# Patient Record
Sex: Male | Born: 1945
Health system: Southern US, Community
[De-identification: ages and names within clinical notes are randomized; demographics above are authoritative.]

## PROBLEM LIST (undated history)

## (undated) DIAGNOSIS — I10 Essential (primary) hypertension: Secondary | ICD-10-CM

## (undated) DIAGNOSIS — J439 Emphysema, unspecified: Secondary | ICD-10-CM

## (undated) DIAGNOSIS — F410 Panic disorder [episodic paroxysmal anxiety] without agoraphobia: Secondary | ICD-10-CM

## (undated) DIAGNOSIS — G4733 Obstructive sleep apnea (adult) (pediatric): Secondary | ICD-10-CM

## (undated) HISTORY — DX: Obstructive sleep apnea (adult) (pediatric): G47.33

## (undated) HISTORY — DX: Panic disorder (episodic paroxysmal anxiety): F41.0

## (undated) HISTORY — DX: Essential (primary) hypertension: I10

## (undated) HISTORY — DX: Emphysema, unspecified: J43.9

---

## 2011-03-21 DIAGNOSIS — J209 Acute bronchitis, unspecified: Secondary | ICD-10-CM | POA: Diagnosis not present

## 2011-03-21 DIAGNOSIS — R0989 Other specified symptoms and signs involving the circulatory and respiratory systems: Secondary | ICD-10-CM | POA: Diagnosis not present

## 2011-03-21 DIAGNOSIS — J449 Chronic obstructive pulmonary disease, unspecified: Secondary | ICD-10-CM | POA: Diagnosis not present

## 2011-04-19 DIAGNOSIS — R635 Abnormal weight gain: Secondary | ICD-10-CM | POA: Diagnosis not present

## 2011-04-19 DIAGNOSIS — R413 Other amnesia: Secondary | ICD-10-CM | POA: Diagnosis not present

## 2011-04-19 DIAGNOSIS — N401 Enlarged prostate with lower urinary tract symptoms: Secondary | ICD-10-CM | POA: Diagnosis not present

## 2011-04-19 DIAGNOSIS — Z125 Encounter for screening for malignant neoplasm of prostate: Secondary | ICD-10-CM | POA: Diagnosis not present

## 2011-04-19 DIAGNOSIS — N138 Other obstructive and reflux uropathy: Secondary | ICD-10-CM | POA: Diagnosis not present

## 2011-04-23 DIAGNOSIS — Z1211 Encounter for screening for malignant neoplasm of colon: Secondary | ICD-10-CM | POA: Diagnosis not present

## 2011-04-30 DIAGNOSIS — I70219 Atherosclerosis of native arteries of extremities with intermittent claudication, unspecified extremity: Secondary | ICD-10-CM | POA: Diagnosis not present

## 2011-04-30 DIAGNOSIS — I2510 Atherosclerotic heart disease of native coronary artery without angina pectoris: Secondary | ICD-10-CM | POA: Diagnosis not present

## 2011-05-23 DIAGNOSIS — Z79899 Other long term (current) drug therapy: Secondary | ICD-10-CM | POA: Diagnosis not present

## 2011-05-23 DIAGNOSIS — Z23 Encounter for immunization: Secondary | ICD-10-CM | POA: Diagnosis not present

## 2011-05-23 DIAGNOSIS — F321 Major depressive disorder, single episode, moderate: Secondary | ICD-10-CM | POA: Diagnosis not present

## 2011-05-29 DIAGNOSIS — R5381 Other malaise: Secondary | ICD-10-CM | POA: Diagnosis not present

## 2011-05-29 DIAGNOSIS — R0789 Other chest pain: Secondary | ICD-10-CM | POA: Diagnosis not present

## 2011-06-05 DIAGNOSIS — F321 Major depressive disorder, single episode, moderate: Secondary | ICD-10-CM | POA: Diagnosis not present

## 2011-06-08 DIAGNOSIS — F321 Major depressive disorder, single episode, moderate: Secondary | ICD-10-CM | POA: Diagnosis not present

## 2011-09-26 DIAGNOSIS — F411 Generalized anxiety disorder: Secondary | ICD-10-CM | POA: Diagnosis not present

## 2011-09-26 DIAGNOSIS — R413 Other amnesia: Secondary | ICD-10-CM | POA: Diagnosis not present

## 2011-10-04 DIAGNOSIS — Z79899 Other long term (current) drug therapy: Secondary | ICD-10-CM | POA: Diagnosis not present

## 2011-10-04 DIAGNOSIS — R413 Other amnesia: Secondary | ICD-10-CM | POA: Diagnosis not present

## 2011-10-17 DIAGNOSIS — Z09 Encounter for follow-up examination after completed treatment for conditions other than malignant neoplasm: Secondary | ICD-10-CM | POA: Diagnosis not present

## 2011-12-17 DIAGNOSIS — Z23 Encounter for immunization: Secondary | ICD-10-CM | POA: Diagnosis not present

## 2012-01-17 DIAGNOSIS — J449 Chronic obstructive pulmonary disease, unspecified: Secondary | ICD-10-CM | POA: Diagnosis not present

## 2012-01-17 DIAGNOSIS — E782 Mixed hyperlipidemia: Secondary | ICD-10-CM | POA: Diagnosis not present

## 2012-01-17 DIAGNOSIS — R413 Other amnesia: Secondary | ICD-10-CM | POA: Diagnosis not present

## 2012-01-17 DIAGNOSIS — Z79899 Other long term (current) drug therapy: Secondary | ICD-10-CM | POA: Diagnosis not present

## 2012-02-13 DIAGNOSIS — J449 Chronic obstructive pulmonary disease, unspecified: Secondary | ICD-10-CM | POA: Diagnosis not present

## 2012-02-13 DIAGNOSIS — R413 Other amnesia: Secondary | ICD-10-CM | POA: Diagnosis not present

## 2012-05-14 DIAGNOSIS — J449 Chronic obstructive pulmonary disease, unspecified: Secondary | ICD-10-CM | POA: Diagnosis not present

## 2012-05-15 ENCOUNTER — Encounter: Payer: Self-pay | Admitting: Internal Medicine

## 2012-05-15 ENCOUNTER — Ambulatory Visit (INDEPENDENT_AMBULATORY_CARE_PROVIDER_SITE_OTHER): Payer: Medicare Other | Admitting: Internal Medicine

## 2012-05-15 ENCOUNTER — Ambulatory Visit (INDEPENDENT_AMBULATORY_CARE_PROVIDER_SITE_OTHER)
Admission: RE | Admit: 2012-05-15 | Discharge: 2012-05-15 | Disposition: A | Discharge status: Home / Self Care | Payer: Medicare Other | Source: Ambulatory Visit | Attending: Internal Medicine | Admitting: Internal Medicine

## 2012-05-15 VITALS — BP 150/78 | HR 70 | Temp 97.2°F | Ht 72.0 in | Wt 270.6 lb

## 2012-05-15 DIAGNOSIS — J449 Chronic obstructive pulmonary disease, unspecified: Secondary | ICD-10-CM

## 2012-05-15 DIAGNOSIS — I10 Essential (primary) hypertension: Secondary | ICD-10-CM | POA: Diagnosis not present

## 2012-05-15 MED ORDER — OLMESARTAN MEDOXOMIL-HCTZ 20-12.5 MG PO TABS: 1.0000 | ORAL_TABLET | Freq: Every day | ORAL | Status: AC

## 2012-05-15 NOTE — Assessment & Plan Note (Signed)
ACE inhibitors are problematic in  pts with airway complaints because  even experienced pulmonologists can't always distinguish ace effects from copd/asthma.  By themselves they don't actually cause a problem, much like oxygen can't by itself start a fire, but they certainly serve as a powerful catalyst or enhancer for any "fire"  or inflammatory process in the upper airway, be it caused by an ET  tube or more commonly reflux (especially in the obese or pts with known GERD or who are on biphoshonates).    In the era of ARB near equivalency until we have a better handle on the reversibility of the airway problem, it just makes sense to avoid ACEI  entirely in the short run and then decide later, having established a level of airway control using a reasonable limited regimen, whether to add back ace but even then being very careful to observe the pt for worsening airway control and number of meds used/ needed to control symptoms.    Will rx with benicar samples then regroup. See instructions for specific recommendations which were reviewed directly with the patient who was given a copy with highlighter outlining the key components.

## 2012-05-15 NOTE — Progress Notes (Signed)
  Subjective:    Patient ID: Richard Baker, male    DOB: Jul 15, 1945, 67 y.o.   MRN: [id number]  HPI  23 yowm quit smoking around 2000 with onset of doe gradually worse since quit referred by Dr Lorin Picket for pulmonary eval of dx of copd made by Dr Lucile Crater.  05/15/2012 1st pulmonary eval /Wert on chronic acei cc doe x decade but p quit smoking able to do walking exercise and lost about 30 lbs around 2004 "felt really good" but since then gradual worse sob and only a little better with symbicort but still needing nebulizer at least twice daily at baseline.  Notes variable hoarseness and sense of throat and cough/chest congestion.  Can walk slow "as far as I want" like at a mall but can't get in a hurry. Some leg swelling but no correlation with sob.  No obvious pattern to daytime variabilty or assoc purulent sputum or cp or chest tightness, subjective wheeze overt sinus or hb symptoms. No unusual exp hx or h/o childhood pna/ asthma or premature birth to his knowledge.   Sleeping ok without nocturnal  or early am exacerbation  of respiratory  c/o's or need for noct saba. Also denies any obvious fluctuation of symptoms with weather or environmental changes or other aggravating or alleviating factors except as outlined above        Review of Systems  Constitutional: Negative for fever, chills, activity change, appetite change and unexpected weight change.  HENT: Positive for congestion. Negative for sore throat, rhinorrhea, sneezing, trouble swallowing, dental problem, voice change and postnasal drip.   Eyes: Negative for visual disturbance.  Respiratory: Positive for cough and shortness of breath. Negative for choking.   Cardiovascular: Positive for leg swelling. Negative for chest pain.  Gastrointestinal: Negative for nausea, vomiting and abdominal pain.  Genitourinary: Negative for difficulty urinating.  Musculoskeletal: Negative for arthralgias.  Skin: Negative for rash.  Psychiatric/Behavioral:  Negative for behavioral problems and confusion.       Objective:   Physical Exam Wt Readings from Last 3 Encounters:  05/15/12 270 lb 9.6 oz (122.743 kg)   amb obese wm gruff voice  HEENT mild turbinate edema.  Oropharynx no thrush or excess pnd or cobblestoning.  No JVD or cervical adenopathy. Mild accessory muscle hypertrophy. Trachea midline, nl thryroid. Chest was hyperinflated by percussion with diminished breath sounds and moderate increased exp time without wheeze. Hoover sign positive at mid inspiration. Regular rate and rhythm without murmur gallop or rub or increase P2 or edema.  Abd: no hsm, nl excursion. Ext warm without cyanosis or clubbing.    CXR  05/15/2012 :  No radiographic evidence of acute cardiopulmonary disease.        Assessment & Plan:

## 2012-05-15 NOTE — Patient Instructions (Addendum)
Stop lisinopril and start benicar 20/12.5 one daily   Continue symbicort Take 2 puffs first thing in am and then another 2 puffs about 12 hours later.     Only use your albuterol as a rescue medication to be used if you can't catch your breath by resting or doing a relaxed purse lip breathing pattern. The less you use it, the better it will work when you need it.   GERD (REFLUX)  is an extremely common cause of respiratory symptoms, many times with no significant heartburn at all.    It can be treated with medication, but also with lifestyle changes including avoidance of late meals, excessive alcohol, smoking cessation, and avoid fatty foods, chocolate, peppermint, colas, red wine, and acidic juices such as orange juice.  NO MINT OR MENTHOL PRODUCTS SO NO COUGH DROPS  USE SUGARLESS CANDY INSTEAD (jolley ranchers or Stover's)  NO OIL BASED VITAMINS - use powdered substitutes.   Please remember to go to the  x-ray department downstairs for your tests - we will call you with the results when they are available.    Please schedule a follow up office visit in 4 weeks, sooner if needed with pfts

## 2012-05-15 NOTE — Assessment & Plan Note (Addendum)
  When respiratory symptoms begin or become refractory well after a patient reports complete smoking cessation,  Especially when this wasn't the case while they were smoking, a red flag is raised based on the work of Dr Primitivo Gauze which states:  if you quit smoking when your best day FEV1 is still well preserved it is highly unlikely you will progress to severe disease.  That is to say, once the smoking stops,  the symptoms should not suddenly erupt or markedly worsen.  If so, the differential diagnosis should include  obesity/deconditioning,  LPR/Reflux/Aspiration syndromes (always a concern in pts with sign wt gain),  occult CHF, or  especially side effect of medications commonly used in this population, esp acei  rec trial off and then regroup with pfts

## 2012-05-16 NOTE — Progress Notes (Signed)
Quick Note:  Spoke with pt and notified of results per Dr. Wert. Pt verbalized understanding and denied any questions.  ______ 

## 2012-06-19 ENCOUNTER — Ambulatory Visit (INDEPENDENT_AMBULATORY_CARE_PROVIDER_SITE_OTHER): Payer: Medicare Other | Admitting: Internal Medicine

## 2012-06-19 ENCOUNTER — Encounter: Payer: Self-pay | Admitting: Internal Medicine

## 2012-06-19 VITALS — BP 132/72 | HR 84 | Temp 97.0°F | Ht 72.0 in | Wt 267.0 lb

## 2012-06-19 DIAGNOSIS — I10 Essential (primary) hypertension: Secondary | ICD-10-CM | POA: Diagnosis not present

## 2012-06-19 DIAGNOSIS — J449 Chronic obstructive pulmonary disease, unspecified: Secondary | ICD-10-CM

## 2012-06-19 LAB — PULMONARY FUNCTION TEST

## 2012-06-19 MED ORDER — ACLIDINIUM BROMIDE 400 MCG/ACT IN AEPB: 1.0000 | INHALATION_SPRAY | Freq: Two times a day (BID) | RESPIRATORY_TRACT | Status: AC

## 2012-06-19 NOTE — Progress Notes (Signed)
  Subjective:    Patient ID: Richard Baker, male    DOB: 09/24/1945, 67 y.o.   MRN: [id number]  HPI  59 yowm quit smoking around 2000 with onset of doe gradually worse since quit referred by Dr Lorin Picket for pulmonary eval of dx of copd made by Dr Lucile Crater.  05/15/2012 1st pulmonary eval /Estanislado Surgeon on chronic acei cc doe x decade but p quit smoking able to do walking exercise and lost about 30 lbs around 2004 "felt really good" but since then gradual worse sob and only a little better with symbicort but still needing nebulizer at least twice daily at baseline.  Notes variable hoarseness and sense of throat and cough/chest congestion.  Can walk slow "as far as I want" like at a mall but can't get in a hurry. Some leg swelling but no correlation with sob. rec Stop lisinopril and start benicar 20/12.5 one daily  Continue symbicort Take 2 puffs first thing in am and then another 2 puffs about 12 hours later.  Only use your albuterol as a rescue medication to be used if you can't catch your breath by resting or doing a relaxed purse lip breathing pattern. The less you use it, the better it will work when you need it.  GERD diet  06/19/2012 f/u ov/Julieta Rogalski re copd not following instructions re inhaler use "cost too much" Chief Complaint  Patient presents with  . Follow-up    Breathing is the same, no better or worse.   cough much better off acei, doe about the same as his dependence on nebs q am  No obvious pattern to daytime variabilty or assoc purulent sputum or cp or chest tightness, subjective wheeze overt sinus or hb symptoms. No unusual exp hx or h/o childhood pna/ asthma or premature birth to his knowledge.   Sleeping ok without nocturnal  or early am exacerbation  of respiratory  c/o's or need for noct saba. Also denies any obvious fluctuation of symptoms with weather or environmental changes or other aggravating or alleviating factors except as outlined above   ROS  The following are not active complaints  unless bolded sore throat, dysphagia, dental problems, itching, sneezing,  nasal congestion or excess/ purulent secretions, ear ache,   fever, chills, sweats, unintended wt loss, pleuritic or exertional cp, hemoptysis,  orthopnea pnd or leg swelling, presyncope, palpitations, heartburn, abdominal pain, anorexia, nausea, vomiting, diarrhea  or change in bowel or urinary habits, change in stools or urine, dysuria,hematuria,  rash, arthralgias, visual complaints, headache, numbness weakness or ataxia or problems with walking or coordination,  change in mood/affect or memory.              Objective:   Physical Exam  06/19/2012   267  Wt Readings from Last 3 Encounters:  05/15/12 270 lb 9.6 oz (122.743 kg)   amb obese wm gruff voice  HEENT mild turbinate edema.  Oropharynx no thrush or excess pnd or cobblestoning.  No JVD or cervical adenopathy. Mild accessory muscle hypertrophy. Trachea midline, nl thryroid. Chest was hyperinflated by percussion with diminished breath sounds and moderate increased exp time without wheeze. Hoover sign positive at mid inspiration. Regular rate and rhythm without murmur gallop or rub or increase P2 or edema.  Abd: no hsm, nl excursion. Ext warm without cyanosis or clubbing.    CXR  05/15/2012 :  No radiographic evidence of acute cardiopulmonary disease.        Assessment & Plan:

## 2012-06-19 NOTE — Patient Instructions (Addendum)
Continue symbicort Take 2 puffs first thing in am and then another 2 puffs about 12 hours later.   Tudorza (green inhaler) one puff immediately after you use symbicort  Only use your duoneb as a rescue medication to be used if you can't catch your breath by resting or doing a relaxed purse lip breathing pattern. The less you use it, the better it will work when you need it.   GERD (REFLUX)  is an extremely common cause of respiratory symptoms, many times with no significant heartburn at all.    It can be treated with medication, but also with lifestyle changes including avoidance of late meals, excessive alcohol, smoking cessation, and avoid fatty foods, chocolate, peppermint, colas, red wine, and acidic juices such as orange juice.  NO MINT OR MENTHOL PRODUCTS SO NO COUGH DROPS  USE SUGARLESS CANDY INSTEAD (jolley ranchers or Stover's)  NO OIL BASED VITAMINS - use powdered substitutes.    Please schedule a follow up office visit in 6 weeks  - ? Change to duoneb/bud next ov

## 2012-06-19 NOTE — Progress Notes (Signed)
PFT done today. Klaire Court,CMA  

## 2012-06-20 NOTE — Assessment & Plan Note (Addendum)
-   Trial off acei  05/15/12 -  hfa 75% 06/19/2012  - PFT's 06/19/2012 FEV1 1.26 (39%) ratio 29 and no change p B2,  DLC0 81 - 06/19/2012  Walked RA x 3 laps @ 185 ft each stopped due to end of study, sats 89% at end   The proper method of use, as well as anticipated side effects, of a metered-dose inhaler are discussed and demonstrated to the patient. Improved effectiveness after extensive coaching during this visit to a level of approximately  75% so try symbicort and tudorza to see what benefit this free trial has vs duoneb and if none then ok to change to duoneb as maint (vs ICS/LABA) since his neb meds are affordable though Part B where his inhalers are part D.

## 2012-06-20 NOTE — Assessment & Plan Note (Signed)
D/c acei 05/15/2012 > cough better but still hoarse  Continue benicar for now as bp well controlled and samples need to be used until sort out the cause of all of his copd-like symptoms

## 2012-07-04 ENCOUNTER — Encounter: Payer: Self-pay | Admitting: Internal Medicine

## 2012-08-04 ENCOUNTER — Ambulatory Visit: Payer: Medicare Other | Admitting: Internal Medicine

## 2012-08-08 DIAGNOSIS — I10 Essential (primary) hypertension: Secondary | ICD-10-CM | POA: Diagnosis not present

## 2012-08-08 DIAGNOSIS — E782 Mixed hyperlipidemia: Secondary | ICD-10-CM | POA: Diagnosis not present

## 2012-08-08 DIAGNOSIS — Z79899 Other long term (current) drug therapy: Secondary | ICD-10-CM | POA: Diagnosis not present

## 2012-08-08 DIAGNOSIS — J449 Chronic obstructive pulmonary disease, unspecified: Secondary | ICD-10-CM | POA: Diagnosis not present

## 2012-08-11 DIAGNOSIS — R7301 Impaired fasting glucose: Secondary | ICD-10-CM | POA: Diagnosis not present

## 2012-11-18 DIAGNOSIS — M5430 Sciatica, unspecified side: Secondary | ICD-10-CM | POA: Diagnosis not present

## 2012-11-18 DIAGNOSIS — IMO0002 Reserved for concepts with insufficient information to code with codable children: Secondary | ICD-10-CM | POA: Diagnosis not present

## 2012-11-26 DIAGNOSIS — Z23 Encounter for immunization: Secondary | ICD-10-CM | POA: Diagnosis not present

## 2012-12-03 DIAGNOSIS — M5137 Other intervertebral disc degeneration, lumbosacral region: Secondary | ICD-10-CM | POA: Diagnosis not present

## 2012-12-03 DIAGNOSIS — M79609 Pain in unspecified limb: Secondary | ICD-10-CM | POA: Diagnosis not present

## 2012-12-03 DIAGNOSIS — M5380 Other specified dorsopathies, site unspecified: Secondary | ICD-10-CM | POA: Diagnosis not present

## 2012-12-03 DIAGNOSIS — M5430 Sciatica, unspecified side: Secondary | ICD-10-CM | POA: Diagnosis not present

## 2012-12-03 DIAGNOSIS — R609 Edema, unspecified: Secondary | ICD-10-CM | POA: Diagnosis not present

## 2012-12-05 DIAGNOSIS — M5430 Sciatica, unspecified side: Secondary | ICD-10-CM | POA: Diagnosis not present

## 2012-12-08 DIAGNOSIS — M5430 Sciatica, unspecified side: Secondary | ICD-10-CM | POA: Diagnosis not present

## 2012-12-11 DIAGNOSIS — M5430 Sciatica, unspecified side: Secondary | ICD-10-CM | POA: Diagnosis not present

## 2013-02-19 DIAGNOSIS — J310 Chronic rhinitis: Secondary | ICD-10-CM | POA: Diagnosis not present

## 2013-02-19 DIAGNOSIS — J449 Chronic obstructive pulmonary disease, unspecified: Secondary | ICD-10-CM | POA: Diagnosis not present

## 2013-02-19 DIAGNOSIS — F411 Generalized anxiety disorder: Secondary | ICD-10-CM | POA: Diagnosis not present

## 2013-02-19 DIAGNOSIS — G4700 Insomnia, unspecified: Secondary | ICD-10-CM | POA: Diagnosis not present

## 2013-02-20 DIAGNOSIS — R0609 Other forms of dyspnea: Secondary | ICD-10-CM | POA: Diagnosis not present

## 2013-02-20 DIAGNOSIS — Z79899 Other long term (current) drug therapy: Secondary | ICD-10-CM | POA: Diagnosis not present

## 2013-02-20 DIAGNOSIS — R000 Tachycardia, unspecified: Secondary | ICD-10-CM | POA: Diagnosis not present

## 2013-02-20 DIAGNOSIS — R609 Edema, unspecified: Secondary | ICD-10-CM | POA: Diagnosis not present

## 2013-03-02 DIAGNOSIS — R609 Edema, unspecified: Secondary | ICD-10-CM | POA: Diagnosis not present

## 2013-03-02 DIAGNOSIS — R000 Tachycardia, unspecified: Secondary | ICD-10-CM | POA: Diagnosis not present

## 2013-03-02 DIAGNOSIS — L820 Inflamed seborrheic keratosis: Secondary | ICD-10-CM | POA: Diagnosis not present

## 2013-03-03 DIAGNOSIS — E782 Mixed hyperlipidemia: Secondary | ICD-10-CM | POA: Diagnosis not present

## 2013-03-03 DIAGNOSIS — J449 Chronic obstructive pulmonary disease, unspecified: Secondary | ICD-10-CM | POA: Diagnosis not present

## 2013-03-03 DIAGNOSIS — I10 Essential (primary) hypertension: Secondary | ICD-10-CM | POA: Diagnosis not present

## 2013-03-09 DIAGNOSIS — L821 Other seborrheic keratosis: Secondary | ICD-10-CM | POA: Diagnosis not present

## 2013-03-22 DIAGNOSIS — I10 Essential (primary) hypertension: Secondary | ICD-10-CM | POA: Diagnosis not present

## 2013-03-22 DIAGNOSIS — R42 Dizziness and giddiness: Secondary | ICD-10-CM | POA: Diagnosis not present

## 2013-05-13 DIAGNOSIS — G4700 Insomnia, unspecified: Secondary | ICD-10-CM | POA: Diagnosis not present

## 2013-05-13 DIAGNOSIS — J310 Chronic rhinitis: Secondary | ICD-10-CM | POA: Diagnosis not present

## 2013-05-13 DIAGNOSIS — F411 Generalized anxiety disorder: Secondary | ICD-10-CM | POA: Diagnosis not present

## 2013-05-13 DIAGNOSIS — J449 Chronic obstructive pulmonary disease, unspecified: Secondary | ICD-10-CM | POA: Diagnosis not present

## 2013-05-13 DIAGNOSIS — G4730 Sleep apnea, unspecified: Secondary | ICD-10-CM | POA: Diagnosis not present

## 2013-07-28 DIAGNOSIS — M999 Biomechanical lesion, unspecified: Secondary | ICD-10-CM | POA: Diagnosis not present

## 2013-07-28 DIAGNOSIS — S339XXA Sprain of unspecified parts of lumbar spine and pelvis, initial encounter: Secondary | ICD-10-CM | POA: Diagnosis not present

## 2013-07-29 DIAGNOSIS — M999 Biomechanical lesion, unspecified: Secondary | ICD-10-CM | POA: Diagnosis not present

## 2013-07-29 DIAGNOSIS — S339XXA Sprain of unspecified parts of lumbar spine and pelvis, initial encounter: Secondary | ICD-10-CM | POA: Diagnosis not present

## 2013-08-03 DIAGNOSIS — M999 Biomechanical lesion, unspecified: Secondary | ICD-10-CM | POA: Diagnosis not present

## 2013-08-03 DIAGNOSIS — S339XXA Sprain of unspecified parts of lumbar spine and pelvis, initial encounter: Secondary | ICD-10-CM | POA: Diagnosis not present

## 2013-08-04 IMAGING — CR DG CHEST 2V
2 series · 2 of 2 positions shown · non-contrast
Comparison: Chest x-ray 05/05/2010.

CLINICAL DATA: Shortness of breath and cough.

CHEST - 2 VIEW

[view not recorded (1 of 2)]
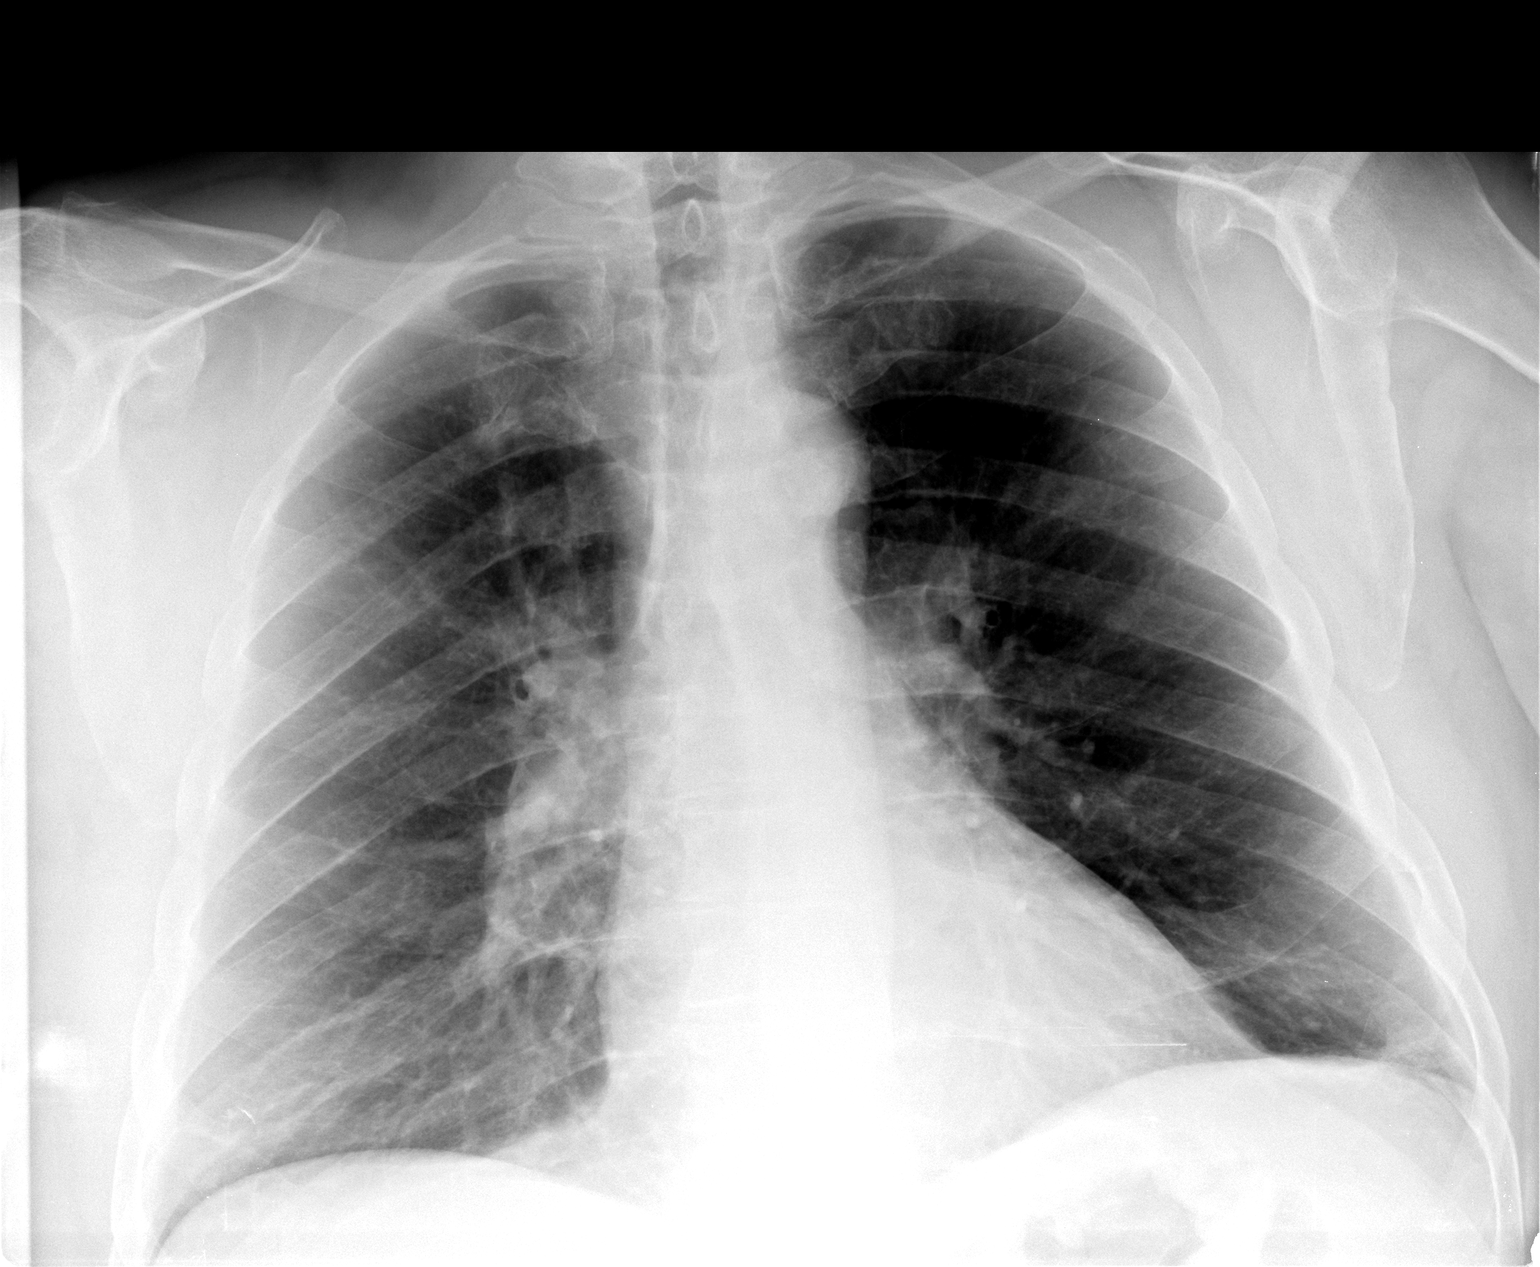

[view not recorded (2 of 2)]
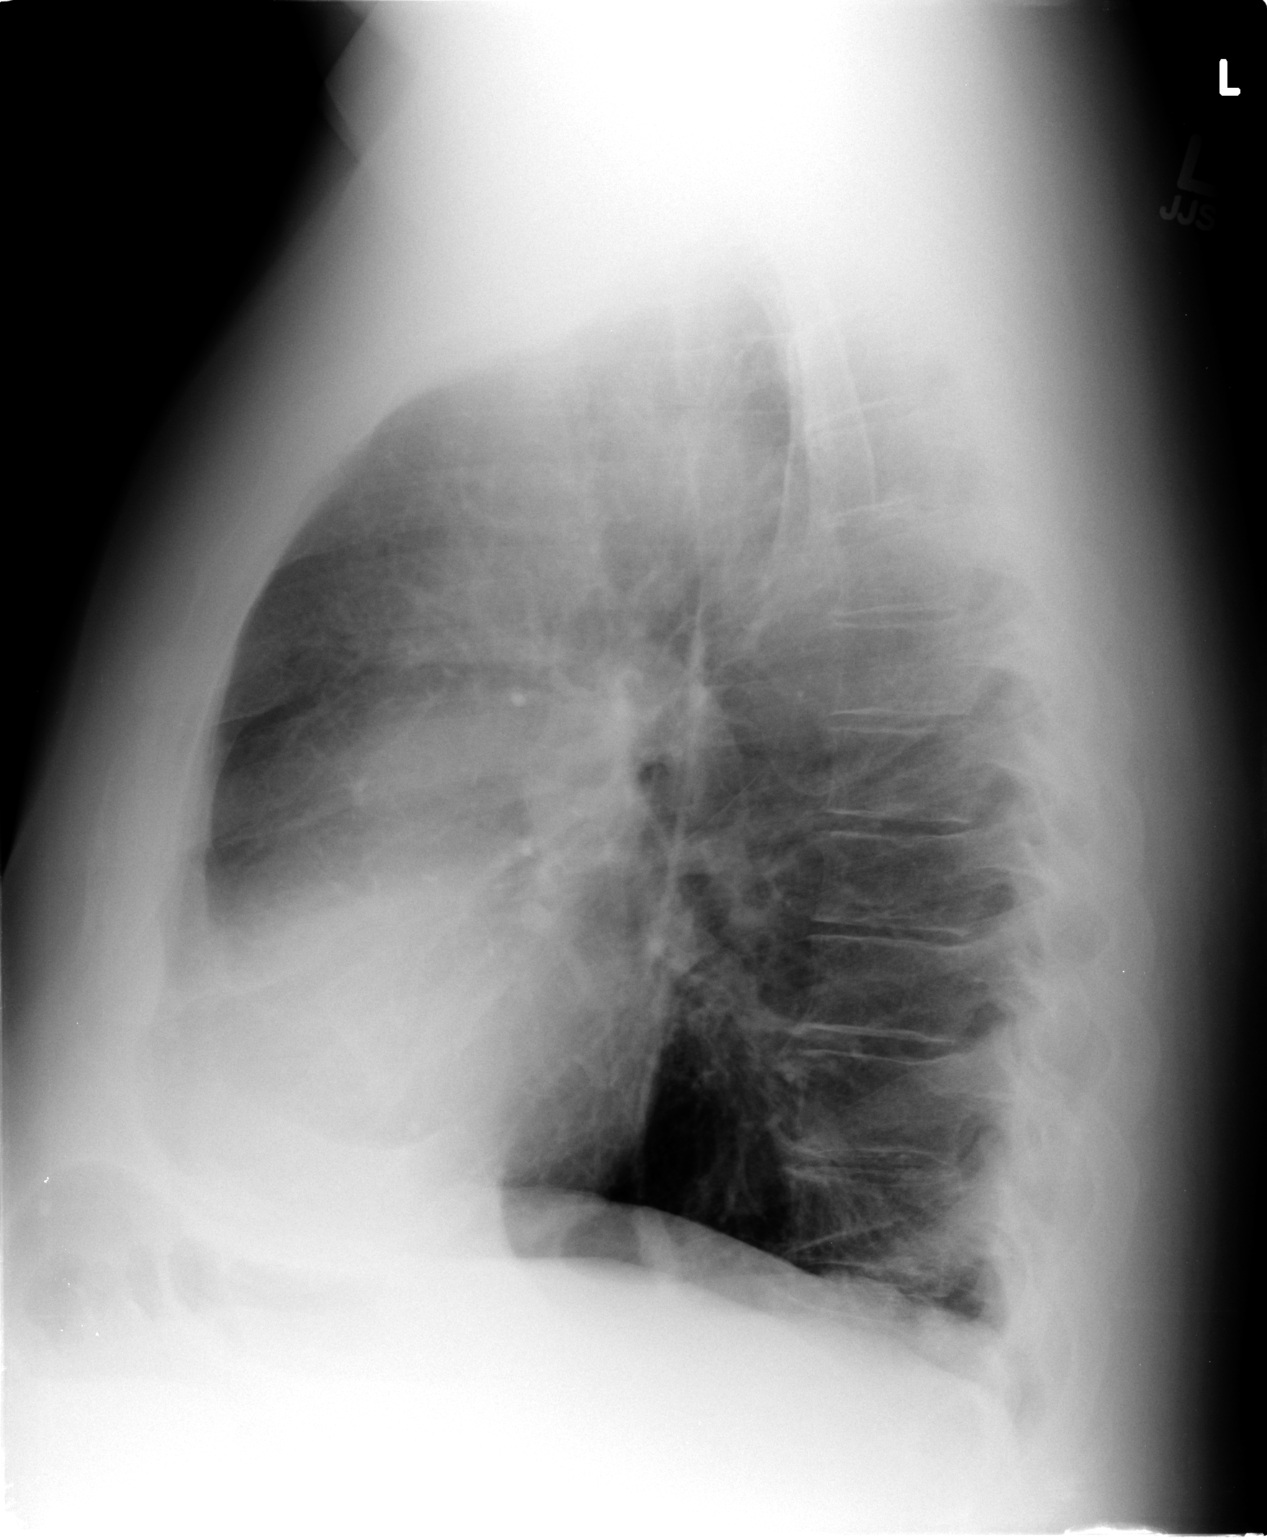

[2 of 2 positions shown; findings below may reference images not displayed]

FINDINGS: There are some linear opacities in the lung bases
bilaterally, similar to prior examinations, compatible with areas
of mild chronic scarring.  No acute consolidative airspace disease.
No pleural effusions.  Pulmonary vasculature and the
cardiomediastinal silhouette are within normal limits.
IMPRESSION: 1.  No radiographic evidence of acute cardiopulmonary disease.

## 2013-08-05 DIAGNOSIS — S339XXA Sprain of unspecified parts of lumbar spine and pelvis, initial encounter: Secondary | ICD-10-CM | POA: Diagnosis not present

## 2013-08-05 DIAGNOSIS — M999 Biomechanical lesion, unspecified: Secondary | ICD-10-CM | POA: Diagnosis not present

## 2013-08-06 DIAGNOSIS — S339XXA Sprain of unspecified parts of lumbar spine and pelvis, initial encounter: Secondary | ICD-10-CM | POA: Diagnosis not present

## 2013-08-06 DIAGNOSIS — M999 Biomechanical lesion, unspecified: Secondary | ICD-10-CM | POA: Diagnosis not present

## 2013-08-12 DIAGNOSIS — G4730 Sleep apnea, unspecified: Secondary | ICD-10-CM | POA: Diagnosis not present

## 2013-08-12 DIAGNOSIS — J449 Chronic obstructive pulmonary disease, unspecified: Secondary | ICD-10-CM | POA: Diagnosis not present

## 2013-08-12 DIAGNOSIS — J310 Chronic rhinitis: Secondary | ICD-10-CM | POA: Diagnosis not present

## 2013-08-12 DIAGNOSIS — G4700 Insomnia, unspecified: Secondary | ICD-10-CM | POA: Diagnosis not present

## 2013-08-24 DIAGNOSIS — M999 Biomechanical lesion, unspecified: Secondary | ICD-10-CM | POA: Diagnosis not present

## 2013-08-24 DIAGNOSIS — S339XXA Sprain of unspecified parts of lumbar spine and pelvis, initial encounter: Secondary | ICD-10-CM | POA: Diagnosis not present

## 2013-08-26 DIAGNOSIS — M999 Biomechanical lesion, unspecified: Secondary | ICD-10-CM | POA: Diagnosis not present

## 2013-08-26 DIAGNOSIS — S339XXA Sprain of unspecified parts of lumbar spine and pelvis, initial encounter: Secondary | ICD-10-CM | POA: Diagnosis not present

## 2013-08-27 DIAGNOSIS — M999 Biomechanical lesion, unspecified: Secondary | ICD-10-CM | POA: Diagnosis not present

## 2013-08-27 DIAGNOSIS — S339XXA Sprain of unspecified parts of lumbar spine and pelvis, initial encounter: Secondary | ICD-10-CM | POA: Diagnosis not present

## 2013-09-02 DIAGNOSIS — F09 Unspecified mental disorder due to known physiological condition: Secondary | ICD-10-CM | POA: Diagnosis not present

## 2013-09-09 DIAGNOSIS — F411 Generalized anxiety disorder: Secondary | ICD-10-CM | POA: Diagnosis not present

## 2013-09-09 DIAGNOSIS — J449 Chronic obstructive pulmonary disease, unspecified: Secondary | ICD-10-CM | POA: Diagnosis not present

## 2013-09-09 DIAGNOSIS — J310 Chronic rhinitis: Secondary | ICD-10-CM | POA: Diagnosis not present

## 2013-09-09 DIAGNOSIS — G4700 Insomnia, unspecified: Secondary | ICD-10-CM | POA: Diagnosis not present

## 2013-09-22 DIAGNOSIS — G4700 Insomnia, unspecified: Secondary | ICD-10-CM | POA: Diagnosis not present

## 2013-10-06 DIAGNOSIS — J449 Chronic obstructive pulmonary disease, unspecified: Secondary | ICD-10-CM | POA: Diagnosis not present

## 2013-10-06 DIAGNOSIS — R05 Cough: Secondary | ICD-10-CM | POA: Diagnosis not present

## 2013-10-06 DIAGNOSIS — R059 Cough, unspecified: Secondary | ICD-10-CM | POA: Diagnosis not present

## 2013-10-06 DIAGNOSIS — J310 Chronic rhinitis: Secondary | ICD-10-CM | POA: Diagnosis not present

## 2013-10-06 DIAGNOSIS — G4700 Insomnia, unspecified: Secondary | ICD-10-CM | POA: Diagnosis not present

## 2013-10-28 DIAGNOSIS — G4730 Sleep apnea, unspecified: Secondary | ICD-10-CM | POA: Diagnosis not present

## 2013-10-28 DIAGNOSIS — G4700 Insomnia, unspecified: Secondary | ICD-10-CM | POA: Diagnosis not present

## 2013-11-02 DIAGNOSIS — G4730 Sleep apnea, unspecified: Secondary | ICD-10-CM | POA: Diagnosis not present

## 2013-11-02 DIAGNOSIS — J449 Chronic obstructive pulmonary disease, unspecified: Secondary | ICD-10-CM | POA: Diagnosis not present

## 2013-11-02 DIAGNOSIS — G4700 Insomnia, unspecified: Secondary | ICD-10-CM | POA: Diagnosis not present

## 2013-11-02 DIAGNOSIS — F411 Generalized anxiety disorder: Secondary | ICD-10-CM | POA: Diagnosis not present

## 2013-11-02 DIAGNOSIS — J310 Chronic rhinitis: Secondary | ICD-10-CM | POA: Diagnosis not present

## 2013-11-20 DIAGNOSIS — Z23 Encounter for immunization: Secondary | ICD-10-CM | POA: Diagnosis not present

## 2013-11-20 DIAGNOSIS — I4891 Unspecified atrial fibrillation: Secondary | ICD-10-CM | POA: Diagnosis not present

## 2013-11-26 DIAGNOSIS — I4892 Unspecified atrial flutter: Secondary | ICD-10-CM | POA: Diagnosis not present

## 2013-11-26 DIAGNOSIS — I10 Essential (primary) hypertension: Secondary | ICD-10-CM | POA: Diagnosis not present

## 2013-11-26 DIAGNOSIS — J449 Chronic obstructive pulmonary disease, unspecified: Secondary | ICD-10-CM | POA: Diagnosis not present

## 2013-11-26 DIAGNOSIS — G4733 Obstructive sleep apnea (adult) (pediatric): Secondary | ICD-10-CM | POA: Diagnosis not present

## 2013-11-26 DIAGNOSIS — E785 Hyperlipidemia, unspecified: Secondary | ICD-10-CM | POA: Diagnosis not present

## 2013-12-10 DIAGNOSIS — R601 Generalized edema: Secondary | ICD-10-CM | POA: Diagnosis not present

## 2013-12-10 DIAGNOSIS — I10 Essential (primary) hypertension: Secondary | ICD-10-CM | POA: Diagnosis not present

## 2013-12-11 DIAGNOSIS — I483 Typical atrial flutter: Secondary | ICD-10-CM | POA: Diagnosis not present

## 2013-12-23 DIAGNOSIS — I10 Essential (primary) hypertension: Secondary | ICD-10-CM | POA: Diagnosis not present

## 2013-12-23 DIAGNOSIS — Z0000 Encounter for general adult medical examination without abnormal findings: Secondary | ICD-10-CM | POA: Diagnosis not present

## 2013-12-23 DIAGNOSIS — E785 Hyperlipidemia, unspecified: Secondary | ICD-10-CM | POA: Diagnosis not present

## 2013-12-30 DIAGNOSIS — F0390 Unspecified dementia without behavioral disturbance: Secondary | ICD-10-CM | POA: Diagnosis not present

## 2014-01-04 DIAGNOSIS — I10 Essential (primary) hypertension: Secondary | ICD-10-CM | POA: Diagnosis not present

## 2014-01-04 DIAGNOSIS — I483 Typical atrial flutter: Secondary | ICD-10-CM | POA: Diagnosis not present

## 2014-01-04 DIAGNOSIS — J449 Chronic obstructive pulmonary disease, unspecified: Secondary | ICD-10-CM | POA: Diagnosis not present

## 2014-01-04 DIAGNOSIS — I429 Cardiomyopathy, unspecified: Secondary | ICD-10-CM | POA: Diagnosis not present

## 2014-01-11 DIAGNOSIS — I429 Cardiomyopathy, unspecified: Secondary | ICD-10-CM | POA: Diagnosis not present

## 2014-01-11 DIAGNOSIS — I483 Typical atrial flutter: Secondary | ICD-10-CM | POA: Diagnosis not present

## 2014-01-12 DIAGNOSIS — J449 Chronic obstructive pulmonary disease, unspecified: Secondary | ICD-10-CM | POA: Diagnosis not present

## 2014-01-12 DIAGNOSIS — J310 Chronic rhinitis: Secondary | ICD-10-CM | POA: Diagnosis not present

## 2014-01-12 DIAGNOSIS — G4730 Sleep apnea, unspecified: Secondary | ICD-10-CM | POA: Diagnosis not present

## 2014-01-12 DIAGNOSIS — F411 Generalized anxiety disorder: Secondary | ICD-10-CM | POA: Diagnosis not present

## 2014-01-14 DIAGNOSIS — I4892 Unspecified atrial flutter: Secondary | ICD-10-CM | POA: Diagnosis not present

## 2014-01-16 DIAGNOSIS — I200 Unstable angina: Secondary | ICD-10-CM | POA: Diagnosis not present

## 2014-01-16 DIAGNOSIS — I4891 Unspecified atrial fibrillation: Secondary | ICD-10-CM | POA: Diagnosis not present

## 2014-01-16 DIAGNOSIS — E6609 Other obesity due to excess calories: Secondary | ICD-10-CM | POA: Diagnosis not present

## 2014-01-16 DIAGNOSIS — E669 Obesity, unspecified: Secondary | ICD-10-CM | POA: Diagnosis not present

## 2014-01-16 DIAGNOSIS — I517 Cardiomegaly: Secondary | ICD-10-CM | POA: Diagnosis not present

## 2014-01-16 DIAGNOSIS — F0390 Unspecified dementia without behavioral disturbance: Secondary | ICD-10-CM | POA: Diagnosis not present

## 2014-01-16 DIAGNOSIS — R079 Chest pain, unspecified: Secondary | ICD-10-CM | POA: Diagnosis not present

## 2014-01-16 DIAGNOSIS — I482 Chronic atrial fibrillation: Secondary | ICD-10-CM | POA: Diagnosis not present

## 2014-01-16 DIAGNOSIS — J438 Other emphysema: Secondary | ICD-10-CM | POA: Diagnosis not present

## 2014-01-16 DIAGNOSIS — I10 Essential (primary) hypertension: Secondary | ICD-10-CM | POA: Diagnosis not present

## 2014-01-16 DIAGNOSIS — Z79899 Other long term (current) drug therapy: Secondary | ICD-10-CM | POA: Diagnosis not present

## 2014-01-16 DIAGNOSIS — I4892 Unspecified atrial flutter: Secondary | ICD-10-CM | POA: Diagnosis not present

## 2014-01-16 DIAGNOSIS — R0789 Other chest pain: Secondary | ICD-10-CM | POA: Diagnosis not present

## 2014-01-16 DIAGNOSIS — I209 Angina pectoris, unspecified: Secondary | ICD-10-CM | POA: Diagnosis not present

## 2014-01-16 DIAGNOSIS — R072 Precordial pain: Secondary | ICD-10-CM | POA: Diagnosis not present

## 2014-01-16 DIAGNOSIS — K219 Gastro-esophageal reflux disease without esophagitis: Secondary | ICD-10-CM | POA: Diagnosis not present

## 2014-01-17 DIAGNOSIS — I4892 Unspecified atrial flutter: Secondary | ICD-10-CM | POA: Diagnosis not present

## 2014-01-17 DIAGNOSIS — R0789 Other chest pain: Secondary | ICD-10-CM | POA: Diagnosis not present

## 2014-01-18 DIAGNOSIS — I429 Cardiomyopathy, unspecified: Secondary | ICD-10-CM | POA: Diagnosis not present

## 2014-01-18 DIAGNOSIS — I4891 Unspecified atrial fibrillation: Secondary | ICD-10-CM | POA: Diagnosis not present

## 2014-01-18 DIAGNOSIS — I483 Typical atrial flutter: Secondary | ICD-10-CM | POA: Diagnosis not present

## 2014-01-18 DIAGNOSIS — I5023 Acute on chronic systolic (congestive) heart failure: Secondary | ICD-10-CM | POA: Diagnosis not present

## 2014-01-19 DIAGNOSIS — I429 Cardiomyopathy, unspecified: Secondary | ICD-10-CM | POA: Diagnosis not present

## 2014-01-19 DIAGNOSIS — I252 Old myocardial infarction: Secondary | ICD-10-CM | POA: Diagnosis not present

## 2014-01-19 DIAGNOSIS — I209 Angina pectoris, unspecified: Secondary | ICD-10-CM | POA: Diagnosis not present

## 2014-01-19 DIAGNOSIS — R079 Chest pain, unspecified: Secondary | ICD-10-CM | POA: Diagnosis not present

## 2014-01-19 DIAGNOSIS — I483 Typical atrial flutter: Secondary | ICD-10-CM | POA: Diagnosis not present

## 2014-01-25 DIAGNOSIS — F0390 Unspecified dementia without behavioral disturbance: Secondary | ICD-10-CM | POA: Diagnosis not present

## 2014-01-25 DIAGNOSIS — Z0000 Encounter for general adult medical examination without abnormal findings: Secondary | ICD-10-CM | POA: Diagnosis not present

## 2014-01-25 DIAGNOSIS — F341 Dysthymic disorder: Secondary | ICD-10-CM | POA: Diagnosis not present

## 2014-02-08 DIAGNOSIS — I483 Typical atrial flutter: Secondary | ICD-10-CM | POA: Diagnosis not present

## 2014-02-08 DIAGNOSIS — I209 Angina pectoris, unspecified: Secondary | ICD-10-CM | POA: Diagnosis not present

## 2014-02-08 DIAGNOSIS — I10 Essential (primary) hypertension: Secondary | ICD-10-CM | POA: Diagnosis not present

## 2014-02-08 DIAGNOSIS — Z7901 Long term (current) use of anticoagulants: Secondary | ICD-10-CM | POA: Diagnosis not present

## 2014-02-24 DIAGNOSIS — I10 Essential (primary) hypertension: Secondary | ICD-10-CM | POA: Diagnosis not present

## 2014-02-24 DIAGNOSIS — I483 Typical atrial flutter: Secondary | ICD-10-CM | POA: Diagnosis not present

## 2014-02-24 DIAGNOSIS — I252 Old myocardial infarction: Secondary | ICD-10-CM | POA: Diagnosis not present

## 2014-02-24 DIAGNOSIS — I4891 Unspecified atrial fibrillation: Secondary | ICD-10-CM | POA: Diagnosis not present

## 2014-02-24 DIAGNOSIS — I429 Cardiomyopathy, unspecified: Secondary | ICD-10-CM | POA: Diagnosis not present

## 2014-02-24 DIAGNOSIS — G4733 Obstructive sleep apnea (adult) (pediatric): Secondary | ICD-10-CM | POA: Diagnosis not present

## 2014-03-29 DIAGNOSIS — I483 Typical atrial flutter: Secondary | ICD-10-CM | POA: Diagnosis not present

## 2014-03-29 DIAGNOSIS — G4733 Obstructive sleep apnea (adult) (pediatric): Secondary | ICD-10-CM | POA: Diagnosis not present

## 2014-03-29 DIAGNOSIS — Z01811 Encounter for preprocedural respiratory examination: Secondary | ICD-10-CM | POA: Diagnosis not present

## 2014-03-29 DIAGNOSIS — I429 Cardiomyopathy, unspecified: Secondary | ICD-10-CM | POA: Diagnosis not present

## 2014-03-29 DIAGNOSIS — I252 Old myocardial infarction: Secondary | ICD-10-CM | POA: Diagnosis not present

## 2014-03-29 DIAGNOSIS — Z79899 Other long term (current) drug therapy: Secondary | ICD-10-CM | POA: Diagnosis not present

## 2014-03-29 DIAGNOSIS — I10 Essential (primary) hypertension: Secondary | ICD-10-CM | POA: Diagnosis not present

## 2014-04-05 DIAGNOSIS — I252 Old myocardial infarction: Secondary | ICD-10-CM | POA: Diagnosis not present

## 2014-04-05 DIAGNOSIS — I10 Essential (primary) hypertension: Secondary | ICD-10-CM | POA: Diagnosis not present

## 2014-04-05 DIAGNOSIS — I483 Typical atrial flutter: Secondary | ICD-10-CM | POA: Diagnosis not present

## 2014-04-05 DIAGNOSIS — G4733 Obstructive sleep apnea (adult) (pediatric): Secondary | ICD-10-CM | POA: Diagnosis not present

## 2014-04-05 DIAGNOSIS — Z79899 Other long term (current) drug therapy: Secondary | ICD-10-CM | POA: Diagnosis not present

## 2014-04-05 DIAGNOSIS — I480 Paroxysmal atrial fibrillation: Secondary | ICD-10-CM | POA: Diagnosis not present

## 2014-04-05 DIAGNOSIS — I429 Cardiomyopathy, unspecified: Secondary | ICD-10-CM | POA: Diagnosis not present

## 2014-04-06 DIAGNOSIS — I483 Typical atrial flutter: Secondary | ICD-10-CM | POA: Diagnosis not present

## 2014-04-06 DIAGNOSIS — I10 Essential (primary) hypertension: Secondary | ICD-10-CM | POA: Diagnosis not present

## 2014-04-06 DIAGNOSIS — I480 Paroxysmal atrial fibrillation: Secondary | ICD-10-CM | POA: Diagnosis not present

## 2014-04-06 DIAGNOSIS — I252 Old myocardial infarction: Secondary | ICD-10-CM | POA: Diagnosis not present

## 2014-04-06 DIAGNOSIS — Z79899 Other long term (current) drug therapy: Secondary | ICD-10-CM | POA: Diagnosis not present

## 2014-04-06 DIAGNOSIS — I429 Cardiomyopathy, unspecified: Secondary | ICD-10-CM | POA: Diagnosis not present

## 2014-04-06 DIAGNOSIS — G4733 Obstructive sleep apnea (adult) (pediatric): Secondary | ICD-10-CM | POA: Diagnosis not present

## 2014-04-12 DIAGNOSIS — Z23 Encounter for immunization: Secondary | ICD-10-CM | POA: Diagnosis not present

## 2014-05-17 DIAGNOSIS — I10 Essential (primary) hypertension: Secondary | ICD-10-CM | POA: Diagnosis not present

## 2014-05-19 DIAGNOSIS — I429 Cardiomyopathy, unspecified: Secondary | ICD-10-CM | POA: Diagnosis not present

## 2014-05-19 DIAGNOSIS — I483 Typical atrial flutter: Secondary | ICD-10-CM | POA: Diagnosis not present

## 2014-05-19 DIAGNOSIS — I10 Essential (primary) hypertension: Secondary | ICD-10-CM | POA: Diagnosis not present

## 2014-05-19 DIAGNOSIS — I110 Hypertensive heart disease with heart failure: Secondary | ICD-10-CM | POA: Diagnosis not present

## 2014-05-19 DIAGNOSIS — I252 Old myocardial infarction: Secondary | ICD-10-CM | POA: Diagnosis not present

## 2014-05-27 DIAGNOSIS — E785 Hyperlipidemia, unspecified: Secondary | ICD-10-CM | POA: Diagnosis not present

## 2014-05-27 DIAGNOSIS — I4892 Unspecified atrial flutter: Secondary | ICD-10-CM | POA: Diagnosis not present

## 2014-05-27 DIAGNOSIS — I2510 Atherosclerotic heart disease of native coronary artery without angina pectoris: Secondary | ICD-10-CM | POA: Diagnosis not present

## 2014-06-02 DIAGNOSIS — I5032 Chronic diastolic (congestive) heart failure: Secondary | ICD-10-CM | POA: Diagnosis not present

## 2014-06-02 DIAGNOSIS — I483 Typical atrial flutter: Secondary | ICD-10-CM | POA: Diagnosis not present

## 2014-06-02 DIAGNOSIS — J449 Chronic obstructive pulmonary disease, unspecified: Secondary | ICD-10-CM | POA: Diagnosis not present

## 2014-07-13 DIAGNOSIS — E785 Hyperlipidemia, unspecified: Secondary | ICD-10-CM | POA: Diagnosis not present

## 2014-07-13 DIAGNOSIS — I10 Essential (primary) hypertension: Secondary | ICD-10-CM | POA: Diagnosis not present

## 2014-07-13 DIAGNOSIS — I2510 Atherosclerotic heart disease of native coronary artery without angina pectoris: Secondary | ICD-10-CM | POA: Diagnosis not present

## 2014-07-13 DIAGNOSIS — I483 Typical atrial flutter: Secondary | ICD-10-CM | POA: Diagnosis not present

## 2014-07-22 DIAGNOSIS — I371 Nonrheumatic pulmonary valve insufficiency: Secondary | ICD-10-CM | POA: Diagnosis not present

## 2014-07-22 DIAGNOSIS — I351 Nonrheumatic aortic (valve) insufficiency: Secondary | ICD-10-CM | POA: Diagnosis not present

## 2015-01-14 DIAGNOSIS — J432 Centrilobular emphysema: Secondary | ICD-10-CM | POA: Diagnosis not present

## 2015-01-14 DIAGNOSIS — Z9181 History of falling: Secondary | ICD-10-CM | POA: Diagnosis not present

## 2015-01-14 DIAGNOSIS — Z23 Encounter for immunization: Secondary | ICD-10-CM | POA: Diagnosis not present

## 2015-01-14 DIAGNOSIS — I509 Heart failure, unspecified: Secondary | ICD-10-CM | POA: Diagnosis not present

## 2015-01-17 DIAGNOSIS — I483 Typical atrial flutter: Secondary | ICD-10-CM | POA: Diagnosis not present

## 2015-01-17 DIAGNOSIS — I429 Cardiomyopathy, unspecified: Secondary | ICD-10-CM | POA: Diagnosis not present

## 2015-01-17 DIAGNOSIS — I10 Essential (primary) hypertension: Secondary | ICD-10-CM | POA: Diagnosis not present

## 2015-04-07 DIAGNOSIS — Z7901 Long term (current) use of anticoagulants: Secondary | ICD-10-CM | POA: Diagnosis not present

## 2015-04-07 DIAGNOSIS — I5032 Chronic diastolic (congestive) heart failure: Secondary | ICD-10-CM | POA: Diagnosis not present

## 2015-04-07 DIAGNOSIS — I483 Typical atrial flutter: Secondary | ICD-10-CM | POA: Diagnosis not present

## 2015-04-07 DIAGNOSIS — I429 Cardiomyopathy, unspecified: Secondary | ICD-10-CM | POA: Diagnosis not present

## 2015-04-07 DIAGNOSIS — J449 Chronic obstructive pulmonary disease, unspecified: Secondary | ICD-10-CM | POA: Diagnosis not present

## 2015-04-13 DIAGNOSIS — Z0000 Encounter for general adult medical examination without abnormal findings: Secondary | ICD-10-CM | POA: Diagnosis not present

## 2015-04-13 DIAGNOSIS — R413 Other amnesia: Secondary | ICD-10-CM | POA: Diagnosis not present

## 2015-04-13 DIAGNOSIS — I509 Heart failure, unspecified: Secondary | ICD-10-CM | POA: Diagnosis not present

## 2015-04-13 DIAGNOSIS — J432 Centrilobular emphysema: Secondary | ICD-10-CM | POA: Diagnosis not present

## 2015-04-15 DIAGNOSIS — I5032 Chronic diastolic (congestive) heart failure: Secondary | ICD-10-CM | POA: Diagnosis not present

## 2015-04-15 DIAGNOSIS — I429 Cardiomyopathy, unspecified: Secondary | ICD-10-CM | POA: Diagnosis not present

## 2015-04-25 DIAGNOSIS — R0602 Shortness of breath: Secondary | ICD-10-CM | POA: Diagnosis not present

## 2015-04-25 DIAGNOSIS — J189 Pneumonia, unspecified organism: Secondary | ICD-10-CM | POA: Diagnosis not present

## 2015-04-25 DIAGNOSIS — Z87891 Personal history of nicotine dependence: Secondary | ICD-10-CM | POA: Diagnosis not present

## 2015-04-25 DIAGNOSIS — R0600 Dyspnea, unspecified: Secondary | ICD-10-CM | POA: Diagnosis not present

## 2015-04-25 DIAGNOSIS — Z741 Need for assistance with personal care: Secondary | ICD-10-CM | POA: Diagnosis not present

## 2015-04-25 DIAGNOSIS — R262 Difficulty in walking, not elsewhere classified: Secondary | ICD-10-CM | POA: Diagnosis not present

## 2015-04-25 DIAGNOSIS — K219 Gastro-esophageal reflux disease without esophagitis: Secondary | ICD-10-CM | POA: Diagnosis present

## 2015-04-25 DIAGNOSIS — I509 Heart failure, unspecified: Secondary | ICD-10-CM | POA: Diagnosis not present

## 2015-04-25 DIAGNOSIS — R000 Tachycardia, unspecified: Secondary | ICD-10-CM | POA: Diagnosis not present

## 2015-04-25 DIAGNOSIS — Z9981 Dependence on supplemental oxygen: Secondary | ICD-10-CM | POA: Diagnosis not present

## 2015-04-25 DIAGNOSIS — J441 Chronic obstructive pulmonary disease with (acute) exacerbation: Secondary | ICD-10-CM | POA: Diagnosis not present

## 2015-04-25 DIAGNOSIS — M1990 Unspecified osteoarthritis, unspecified site: Secondary | ICD-10-CM | POA: Diagnosis not present

## 2015-04-25 DIAGNOSIS — I482 Chronic atrial fibrillation: Secondary | ICD-10-CM | POA: Diagnosis present

## 2015-04-25 DIAGNOSIS — G301 Alzheimer's disease with late onset: Secondary | ICD-10-CM | POA: Diagnosis not present

## 2015-04-25 DIAGNOSIS — J9691 Respiratory failure, unspecified with hypoxia: Secondary | ICD-10-CM | POA: Diagnosis not present

## 2015-04-25 DIAGNOSIS — I38 Endocarditis, valve unspecified: Secondary | ICD-10-CM | POA: Diagnosis not present

## 2015-04-25 DIAGNOSIS — I10 Essential (primary) hypertension: Secondary | ICD-10-CM | POA: Diagnosis not present

## 2015-04-25 DIAGNOSIS — F0390 Unspecified dementia without behavioral disturbance: Secondary | ICD-10-CM | POA: Diagnosis not present

## 2015-04-25 DIAGNOSIS — N400 Enlarged prostate without lower urinary tract symptoms: Secondary | ICD-10-CM | POA: Diagnosis present

## 2015-04-25 DIAGNOSIS — F0280 Dementia in other diseases classified elsewhere without behavioral disturbance: Secondary | ICD-10-CM | POA: Diagnosis present

## 2015-04-25 DIAGNOSIS — I480 Paroxysmal atrial fibrillation: Secondary | ICD-10-CM | POA: Diagnosis not present

## 2015-04-25 DIAGNOSIS — I110 Hypertensive heart disease with heart failure: Secondary | ICD-10-CM | POA: Diagnosis not present

## 2015-04-25 DIAGNOSIS — R488 Other symbolic dysfunctions: Secondary | ICD-10-CM | POA: Diagnosis not present

## 2015-04-25 DIAGNOSIS — Z79899 Other long term (current) drug therapy: Secondary | ICD-10-CM | POA: Diagnosis not present

## 2015-04-25 DIAGNOSIS — E7800 Pure hypercholesterolemia, unspecified: Secondary | ICD-10-CM | POA: Diagnosis present

## 2015-04-25 DIAGNOSIS — F419 Anxiety disorder, unspecified: Secondary | ICD-10-CM | POA: Diagnosis not present

## 2015-04-25 DIAGNOSIS — J180 Bronchopneumonia, unspecified organism: Secondary | ICD-10-CM | POA: Diagnosis present

## 2015-04-25 DIAGNOSIS — I2510 Atherosclerotic heart disease of native coronary artery without angina pectoris: Secondary | ICD-10-CM | POA: Diagnosis present

## 2015-04-25 DIAGNOSIS — J9601 Acute respiratory failure with hypoxia: Secondary | ICD-10-CM | POA: Diagnosis present

## 2015-04-25 DIAGNOSIS — I5189 Other ill-defined heart diseases: Secondary | ICD-10-CM | POA: Diagnosis present

## 2015-04-25 DIAGNOSIS — R279 Unspecified lack of coordination: Secondary | ICD-10-CM | POA: Diagnosis not present

## 2015-04-25 DIAGNOSIS — J440 Chronic obstructive pulmonary disease with acute lower respiratory infection: Secondary | ICD-10-CM | POA: Diagnosis not present

## 2015-04-25 DIAGNOSIS — M6281 Muscle weakness (generalized): Secondary | ICD-10-CM | POA: Diagnosis not present

## 2015-04-25 DIAGNOSIS — R410 Disorientation, unspecified: Secondary | ICD-10-CM | POA: Diagnosis not present

## 2015-05-01 DIAGNOSIS — J180 Bronchopneumonia, unspecified organism: Secondary | ICD-10-CM | POA: Diagnosis not present

## 2015-05-01 DIAGNOSIS — R279 Unspecified lack of coordination: Secondary | ICD-10-CM | POA: Diagnosis not present

## 2015-05-01 DIAGNOSIS — I2510 Atherosclerotic heart disease of native coronary artery without angina pectoris: Secondary | ICD-10-CM | POA: Diagnosis not present

## 2015-05-01 DIAGNOSIS — Z9981 Dependence on supplemental oxygen: Secondary | ICD-10-CM | POA: Diagnosis not present

## 2015-05-01 DIAGNOSIS — J441 Chronic obstructive pulmonary disease with (acute) exacerbation: Secondary | ICD-10-CM | POA: Diagnosis not present

## 2015-05-01 DIAGNOSIS — R262 Difficulty in walking, not elsewhere classified: Secondary | ICD-10-CM | POA: Diagnosis not present

## 2015-05-01 DIAGNOSIS — I480 Paroxysmal atrial fibrillation: Secondary | ICD-10-CM | POA: Diagnosis not present

## 2015-05-01 DIAGNOSIS — I509 Heart failure, unspecified: Secondary | ICD-10-CM | POA: Diagnosis not present

## 2015-05-01 DIAGNOSIS — F0390 Unspecified dementia without behavioral disturbance: Secondary | ICD-10-CM | POA: Diagnosis not present

## 2015-05-01 DIAGNOSIS — K219 Gastro-esophageal reflux disease without esophagitis: Secondary | ICD-10-CM | POA: Diagnosis not present

## 2015-05-01 DIAGNOSIS — N400 Enlarged prostate without lower urinary tract symptoms: Secondary | ICD-10-CM | POA: Diagnosis not present

## 2015-05-01 DIAGNOSIS — R000 Tachycardia, unspecified: Secondary | ICD-10-CM | POA: Diagnosis not present

## 2015-05-01 DIAGNOSIS — E7800 Pure hypercholesterolemia, unspecified: Secondary | ICD-10-CM | POA: Diagnosis not present

## 2015-05-01 DIAGNOSIS — M1990 Unspecified osteoarthritis, unspecified site: Secondary | ICD-10-CM | POA: Diagnosis not present

## 2015-05-01 DIAGNOSIS — M6281 Muscle weakness (generalized): Secondary | ICD-10-CM | POA: Diagnosis not present

## 2015-05-01 DIAGNOSIS — R410 Disorientation, unspecified: Secondary | ICD-10-CM | POA: Diagnosis not present

## 2015-05-01 DIAGNOSIS — I10 Essential (primary) hypertension: Secondary | ICD-10-CM | POA: Diagnosis not present

## 2015-05-01 DIAGNOSIS — I38 Endocarditis, valve unspecified: Secondary | ICD-10-CM | POA: Diagnosis not present

## 2015-05-01 DIAGNOSIS — J9601 Acute respiratory failure with hypoxia: Secondary | ICD-10-CM | POA: Diagnosis not present

## 2015-05-01 DIAGNOSIS — R488 Other symbolic dysfunctions: Secondary | ICD-10-CM | POA: Diagnosis not present

## 2015-05-01 DIAGNOSIS — F419 Anxiety disorder, unspecified: Secondary | ICD-10-CM | POA: Diagnosis not present

## 2015-05-01 DIAGNOSIS — Z741 Need for assistance with personal care: Secondary | ICD-10-CM | POA: Diagnosis not present

## 2015-05-01 DIAGNOSIS — J189 Pneumonia, unspecified organism: Secondary | ICD-10-CM | POA: Diagnosis not present

## 2015-05-08 DIAGNOSIS — I5032 Chronic diastolic (congestive) heart failure: Secondary | ICD-10-CM | POA: Diagnosis not present

## 2015-05-08 DIAGNOSIS — F0390 Unspecified dementia without behavioral disturbance: Secondary | ICD-10-CM | POA: Diagnosis not present

## 2015-05-08 DIAGNOSIS — F419 Anxiety disorder, unspecified: Secondary | ICD-10-CM | POA: Diagnosis not present

## 2015-05-08 DIAGNOSIS — J180 Bronchopneumonia, unspecified organism: Secondary | ICD-10-CM | POA: Diagnosis not present

## 2015-05-08 DIAGNOSIS — J441 Chronic obstructive pulmonary disease with (acute) exacerbation: Secondary | ICD-10-CM | POA: Diagnosis not present

## 2015-05-08 DIAGNOSIS — J9601 Acute respiratory failure with hypoxia: Secondary | ICD-10-CM | POA: Diagnosis not present

## 2015-05-08 DIAGNOSIS — Z9981 Dependence on supplemental oxygen: Secondary | ICD-10-CM | POA: Diagnosis not present

## 2015-05-08 DIAGNOSIS — Z7952 Long term (current) use of systemic steroids: Secondary | ICD-10-CM | POA: Diagnosis not present

## 2015-05-08 DIAGNOSIS — I110 Hypertensive heart disease with heart failure: Secondary | ICD-10-CM | POA: Diagnosis not present

## 2015-05-08 DIAGNOSIS — I2510 Atherosclerotic heart disease of native coronary artery without angina pectoris: Secondary | ICD-10-CM | POA: Diagnosis not present

## 2015-05-08 DIAGNOSIS — Z9181 History of falling: Secondary | ICD-10-CM | POA: Diagnosis not present

## 2015-05-08 DIAGNOSIS — I480 Paroxysmal atrial fibrillation: Secondary | ICD-10-CM | POA: Diagnosis not present

## 2015-05-08 DIAGNOSIS — Z87891 Personal history of nicotine dependence: Secondary | ICD-10-CM | POA: Diagnosis not present

## 2015-05-08 DIAGNOSIS — J440 Chronic obstructive pulmonary disease with acute lower respiratory infection: Secondary | ICD-10-CM | POA: Diagnosis not present

## 2015-05-09 DIAGNOSIS — I110 Hypertensive heart disease with heart failure: Secondary | ICD-10-CM | POA: Diagnosis not present

## 2015-05-09 DIAGNOSIS — J180 Bronchopneumonia, unspecified organism: Secondary | ICD-10-CM | POA: Diagnosis not present

## 2015-05-09 DIAGNOSIS — J441 Chronic obstructive pulmonary disease with (acute) exacerbation: Secondary | ICD-10-CM | POA: Diagnosis not present

## 2015-05-09 DIAGNOSIS — J9601 Acute respiratory failure with hypoxia: Secondary | ICD-10-CM | POA: Diagnosis not present

## 2015-05-09 DIAGNOSIS — I5032 Chronic diastolic (congestive) heart failure: Secondary | ICD-10-CM | POA: Diagnosis not present

## 2015-05-09 DIAGNOSIS — J440 Chronic obstructive pulmonary disease with acute lower respiratory infection: Secondary | ICD-10-CM | POA: Diagnosis not present

## 2015-05-10 DIAGNOSIS — I110 Hypertensive heart disease with heart failure: Secondary | ICD-10-CM | POA: Diagnosis not present

## 2015-05-10 DIAGNOSIS — J440 Chronic obstructive pulmonary disease with acute lower respiratory infection: Secondary | ICD-10-CM | POA: Diagnosis not present

## 2015-05-10 DIAGNOSIS — J180 Bronchopneumonia, unspecified organism: Secondary | ICD-10-CM | POA: Diagnosis not present

## 2015-05-10 DIAGNOSIS — J9601 Acute respiratory failure with hypoxia: Secondary | ICD-10-CM | POA: Diagnosis not present

## 2015-05-10 DIAGNOSIS — J441 Chronic obstructive pulmonary disease with (acute) exacerbation: Secondary | ICD-10-CM | POA: Diagnosis not present

## 2015-05-10 DIAGNOSIS — I5032 Chronic diastolic (congestive) heart failure: Secondary | ICD-10-CM | POA: Diagnosis not present

## 2015-05-13 DIAGNOSIS — I5032 Chronic diastolic (congestive) heart failure: Secondary | ICD-10-CM | POA: Diagnosis not present

## 2015-05-13 DIAGNOSIS — J180 Bronchopneumonia, unspecified organism: Secondary | ICD-10-CM | POA: Diagnosis not present

## 2015-05-13 DIAGNOSIS — I110 Hypertensive heart disease with heart failure: Secondary | ICD-10-CM | POA: Diagnosis not present

## 2015-05-13 DIAGNOSIS — J9601 Acute respiratory failure with hypoxia: Secondary | ICD-10-CM | POA: Diagnosis not present

## 2015-05-13 DIAGNOSIS — J441 Chronic obstructive pulmonary disease with (acute) exacerbation: Secondary | ICD-10-CM | POA: Diagnosis not present

## 2015-05-13 DIAGNOSIS — J440 Chronic obstructive pulmonary disease with acute lower respiratory infection: Secondary | ICD-10-CM | POA: Diagnosis not present

## 2015-05-18 DIAGNOSIS — J441 Chronic obstructive pulmonary disease with (acute) exacerbation: Secondary | ICD-10-CM | POA: Diagnosis not present

## 2015-05-18 DIAGNOSIS — J180 Bronchopneumonia, unspecified organism: Secondary | ICD-10-CM | POA: Diagnosis not present

## 2015-05-19 DIAGNOSIS — I110 Hypertensive heart disease with heart failure: Secondary | ICD-10-CM | POA: Diagnosis not present

## 2015-05-19 DIAGNOSIS — J440 Chronic obstructive pulmonary disease with acute lower respiratory infection: Secondary | ICD-10-CM | POA: Diagnosis not present

## 2015-05-19 DIAGNOSIS — I5032 Chronic diastolic (congestive) heart failure: Secondary | ICD-10-CM | POA: Diagnosis not present

## 2015-05-19 DIAGNOSIS — J441 Chronic obstructive pulmonary disease with (acute) exacerbation: Secondary | ICD-10-CM | POA: Diagnosis not present

## 2015-05-19 DIAGNOSIS — J9601 Acute respiratory failure with hypoxia: Secondary | ICD-10-CM | POA: Diagnosis not present

## 2015-05-19 DIAGNOSIS — J180 Bronchopneumonia, unspecified organism: Secondary | ICD-10-CM | POA: Diagnosis not present

## 2015-05-20 DIAGNOSIS — R413 Other amnesia: Secondary | ICD-10-CM | POA: Diagnosis not present

## 2015-05-20 DIAGNOSIS — J432 Centrilobular emphysema: Secondary | ICD-10-CM | POA: Diagnosis not present

## 2015-05-20 DIAGNOSIS — I509 Heart failure, unspecified: Secondary | ICD-10-CM | POA: Diagnosis not present

## 2015-05-23 DIAGNOSIS — I483 Typical atrial flutter: Secondary | ICD-10-CM | POA: Diagnosis not present

## 2015-05-23 DIAGNOSIS — I429 Cardiomyopathy, unspecified: Secondary | ICD-10-CM | POA: Diagnosis not present

## 2015-05-24 DIAGNOSIS — I483 Typical atrial flutter: Secondary | ICD-10-CM | POA: Diagnosis not present

## 2015-05-27 DIAGNOSIS — J180 Bronchopneumonia, unspecified organism: Secondary | ICD-10-CM | POA: Diagnosis not present

## 2015-05-27 DIAGNOSIS — J9601 Acute respiratory failure with hypoxia: Secondary | ICD-10-CM | POA: Diagnosis not present

## 2015-05-27 DIAGNOSIS — I5032 Chronic diastolic (congestive) heart failure: Secondary | ICD-10-CM | POA: Diagnosis not present

## 2015-05-27 DIAGNOSIS — J441 Chronic obstructive pulmonary disease with (acute) exacerbation: Secondary | ICD-10-CM | POA: Diagnosis not present

## 2015-05-27 DIAGNOSIS — I110 Hypertensive heart disease with heart failure: Secondary | ICD-10-CM | POA: Diagnosis not present

## 2015-05-27 DIAGNOSIS — J440 Chronic obstructive pulmonary disease with acute lower respiratory infection: Secondary | ICD-10-CM | POA: Diagnosis not present

## 2015-05-31 DIAGNOSIS — J440 Chronic obstructive pulmonary disease with acute lower respiratory infection: Secondary | ICD-10-CM | POA: Diagnosis not present

## 2015-05-31 DIAGNOSIS — J180 Bronchopneumonia, unspecified organism: Secondary | ICD-10-CM | POA: Diagnosis not present

## 2015-05-31 DIAGNOSIS — I5032 Chronic diastolic (congestive) heart failure: Secondary | ICD-10-CM | POA: Diagnosis not present

## 2015-05-31 DIAGNOSIS — J441 Chronic obstructive pulmonary disease with (acute) exacerbation: Secondary | ICD-10-CM | POA: Diagnosis not present

## 2015-05-31 DIAGNOSIS — J9601 Acute respiratory failure with hypoxia: Secondary | ICD-10-CM | POA: Diagnosis not present

## 2015-05-31 DIAGNOSIS — I110 Hypertensive heart disease with heart failure: Secondary | ICD-10-CM | POA: Diagnosis not present

## 2015-06-01 DIAGNOSIS — J9601 Acute respiratory failure with hypoxia: Secondary | ICD-10-CM | POA: Diagnosis not present

## 2015-06-01 DIAGNOSIS — J441 Chronic obstructive pulmonary disease with (acute) exacerbation: Secondary | ICD-10-CM | POA: Diagnosis not present

## 2015-06-01 DIAGNOSIS — J180 Bronchopneumonia, unspecified organism: Secondary | ICD-10-CM | POA: Diagnosis not present

## 2015-06-01 DIAGNOSIS — J440 Chronic obstructive pulmonary disease with acute lower respiratory infection: Secondary | ICD-10-CM | POA: Diagnosis not present

## 2015-06-01 DIAGNOSIS — I5032 Chronic diastolic (congestive) heart failure: Secondary | ICD-10-CM | POA: Diagnosis not present

## 2015-06-01 DIAGNOSIS — R300 Dysuria: Secondary | ICD-10-CM | POA: Diagnosis not present

## 2015-06-01 DIAGNOSIS — I110 Hypertensive heart disease with heart failure: Secondary | ICD-10-CM | POA: Diagnosis not present

## 2015-06-10 DIAGNOSIS — J180 Bronchopneumonia, unspecified organism: Secondary | ICD-10-CM | POA: Diagnosis not present

## 2015-06-10 DIAGNOSIS — J440 Chronic obstructive pulmonary disease with acute lower respiratory infection: Secondary | ICD-10-CM | POA: Diagnosis not present

## 2015-06-10 DIAGNOSIS — I5032 Chronic diastolic (congestive) heart failure: Secondary | ICD-10-CM | POA: Diagnosis not present

## 2015-06-10 DIAGNOSIS — J9601 Acute respiratory failure with hypoxia: Secondary | ICD-10-CM | POA: Diagnosis not present

## 2015-06-10 DIAGNOSIS — I110 Hypertensive heart disease with heart failure: Secondary | ICD-10-CM | POA: Diagnosis not present

## 2015-06-10 DIAGNOSIS — J441 Chronic obstructive pulmonary disease with (acute) exacerbation: Secondary | ICD-10-CM | POA: Diagnosis not present

## 2015-06-14 DIAGNOSIS — I5032 Chronic diastolic (congestive) heart failure: Secondary | ICD-10-CM | POA: Diagnosis not present

## 2015-06-14 DIAGNOSIS — J9601 Acute respiratory failure with hypoxia: Secondary | ICD-10-CM | POA: Diagnosis not present

## 2015-06-14 DIAGNOSIS — J180 Bronchopneumonia, unspecified organism: Secondary | ICD-10-CM | POA: Diagnosis not present

## 2015-06-14 DIAGNOSIS — I110 Hypertensive heart disease with heart failure: Secondary | ICD-10-CM | POA: Diagnosis not present

## 2015-06-14 DIAGNOSIS — J440 Chronic obstructive pulmonary disease with acute lower respiratory infection: Secondary | ICD-10-CM | POA: Diagnosis not present

## 2015-06-14 DIAGNOSIS — J441 Chronic obstructive pulmonary disease with (acute) exacerbation: Secondary | ICD-10-CM | POA: Diagnosis not present

## 2015-06-16 DIAGNOSIS — R413 Other amnesia: Secondary | ICD-10-CM | POA: Diagnosis not present

## 2015-06-16 DIAGNOSIS — I509 Heart failure, unspecified: Secondary | ICD-10-CM | POA: Diagnosis not present

## 2015-06-16 DIAGNOSIS — J432 Centrilobular emphysema: Secondary | ICD-10-CM | POA: Diagnosis not present

## 2015-06-29 DIAGNOSIS — I5032 Chronic diastolic (congestive) heart failure: Secondary | ICD-10-CM | POA: Diagnosis not present

## 2015-06-29 DIAGNOSIS — J9601 Acute respiratory failure with hypoxia: Secondary | ICD-10-CM | POA: Diagnosis not present

## 2015-06-29 DIAGNOSIS — J440 Chronic obstructive pulmonary disease with acute lower respiratory infection: Secondary | ICD-10-CM | POA: Diagnosis not present

## 2015-06-29 DIAGNOSIS — I110 Hypertensive heart disease with heart failure: Secondary | ICD-10-CM | POA: Diagnosis not present

## 2015-06-29 DIAGNOSIS — J180 Bronchopneumonia, unspecified organism: Secondary | ICD-10-CM | POA: Diagnosis not present

## 2015-06-29 DIAGNOSIS — J441 Chronic obstructive pulmonary disease with (acute) exacerbation: Secondary | ICD-10-CM | POA: Diagnosis not present

## 2015-07-14 DIAGNOSIS — I509 Heart failure, unspecified: Secondary | ICD-10-CM | POA: Diagnosis not present

## 2015-07-14 DIAGNOSIS — J449 Chronic obstructive pulmonary disease, unspecified: Secondary | ICD-10-CM | POA: Diagnosis not present

## 2015-07-14 DIAGNOSIS — F0390 Unspecified dementia without behavioral disturbance: Secondary | ICD-10-CM | POA: Diagnosis not present

## 2015-08-31 DIAGNOSIS — J310 Chronic rhinitis: Secondary | ICD-10-CM | POA: Diagnosis not present

## 2015-08-31 DIAGNOSIS — F411 Generalized anxiety disorder: Secondary | ICD-10-CM | POA: Diagnosis not present

## 2015-08-31 DIAGNOSIS — J449 Chronic obstructive pulmonary disease, unspecified: Secondary | ICD-10-CM | POA: Diagnosis not present

## 2015-08-31 DIAGNOSIS — G4733 Obstructive sleep apnea (adult) (pediatric): Secondary | ICD-10-CM | POA: Diagnosis not present

## 2015-09-30 DIAGNOSIS — G4733 Obstructive sleep apnea (adult) (pediatric): Secondary | ICD-10-CM | POA: Diagnosis not present

## 2015-09-30 DIAGNOSIS — J449 Chronic obstructive pulmonary disease, unspecified: Secondary | ICD-10-CM | POA: Diagnosis not present

## 2015-09-30 DIAGNOSIS — F411 Generalized anxiety disorder: Secondary | ICD-10-CM | POA: Diagnosis not present

## 2015-09-30 DIAGNOSIS — J310 Chronic rhinitis: Secondary | ICD-10-CM | POA: Diagnosis not present

## 2015-10-03 DIAGNOSIS — M545 Low back pain: Secondary | ICD-10-CM | POA: Diagnosis not present

## 2015-10-07 DIAGNOSIS — M545 Low back pain: Secondary | ICD-10-CM | POA: Diagnosis not present

## 2015-10-19 DIAGNOSIS — R202 Paresthesia of skin: Secondary | ICD-10-CM | POA: Diagnosis not present

## 2015-11-09 DIAGNOSIS — J309 Allergic rhinitis, unspecified: Secondary | ICD-10-CM | POA: Diagnosis not present

## 2015-11-09 DIAGNOSIS — M47816 Spondylosis without myelopathy or radiculopathy, lumbar region: Secondary | ICD-10-CM | POA: Diagnosis not present

## 2015-11-21 DIAGNOSIS — F0390 Unspecified dementia without behavioral disturbance: Secondary | ICD-10-CM | POA: Diagnosis not present

## 2015-11-21 DIAGNOSIS — Z23 Encounter for immunization: Secondary | ICD-10-CM | POA: Diagnosis not present

## 2015-11-24 DIAGNOSIS — F0390 Unspecified dementia without behavioral disturbance: Secondary | ICD-10-CM | POA: Diagnosis not present

## 2015-11-24 DIAGNOSIS — J449 Chronic obstructive pulmonary disease, unspecified: Secondary | ICD-10-CM | POA: Diagnosis not present

## 2015-11-24 DIAGNOSIS — I483 Typical atrial flutter: Secondary | ICD-10-CM | POA: Diagnosis not present

## 2015-11-24 DIAGNOSIS — I10 Essential (primary) hypertension: Secondary | ICD-10-CM | POA: Diagnosis not present

## 2015-11-24 DIAGNOSIS — I429 Cardiomyopathy, unspecified: Secondary | ICD-10-CM | POA: Diagnosis not present

## 2016-01-12 DIAGNOSIS — Z79899 Other long term (current) drug therapy: Secondary | ICD-10-CM | POA: Diagnosis not present

## 2016-01-12 DIAGNOSIS — R7309 Other abnormal glucose: Secondary | ICD-10-CM | POA: Diagnosis not present

## 2016-01-18 DIAGNOSIS — J432 Centrilobular emphysema: Secondary | ICD-10-CM | POA: Diagnosis not present

## 2016-01-18 DIAGNOSIS — R000 Tachycardia, unspecified: Secondary | ICD-10-CM | POA: Diagnosis not present

## 2016-01-18 DIAGNOSIS — F0390 Unspecified dementia without behavioral disturbance: Secondary | ICD-10-CM | POA: Diagnosis not present

## 2016-01-18 DIAGNOSIS — I4891 Unspecified atrial fibrillation: Secondary | ICD-10-CM | POA: Diagnosis not present

## 2016-01-23 DIAGNOSIS — J189 Pneumonia, unspecified organism: Secondary | ICD-10-CM | POA: Diagnosis not present

## 2016-01-23 DIAGNOSIS — I509 Heart failure, unspecified: Secondary | ICD-10-CM | POA: Diagnosis present

## 2016-01-23 DIAGNOSIS — J9621 Acute and chronic respiratory failure with hypoxia: Secondary | ICD-10-CM | POA: Diagnosis not present

## 2016-01-23 DIAGNOSIS — Z87891 Personal history of nicotine dependence: Secondary | ICD-10-CM | POA: Diagnosis not present

## 2016-01-23 DIAGNOSIS — G301 Alzheimer's disease with late onset: Secondary | ICD-10-CM | POA: Diagnosis not present

## 2016-01-23 DIAGNOSIS — J449 Chronic obstructive pulmonary disease, unspecified: Secondary | ICD-10-CM | POA: Diagnosis present

## 2016-01-23 DIAGNOSIS — K219 Gastro-esophageal reflux disease without esophagitis: Secondary | ICD-10-CM | POA: Diagnosis present

## 2016-01-23 DIAGNOSIS — F0280 Dementia in other diseases classified elsewhere without behavioral disturbance: Secondary | ICD-10-CM | POA: Diagnosis present

## 2016-01-23 DIAGNOSIS — I119 Hypertensive heart disease without heart failure: Secondary | ICD-10-CM | POA: Diagnosis not present

## 2016-01-23 DIAGNOSIS — F0390 Unspecified dementia without behavioral disturbance: Secondary | ICD-10-CM | POA: Diagnosis not present

## 2016-01-23 DIAGNOSIS — R0902 Hypoxemia: Secondary | ICD-10-CM | POA: Diagnosis not present

## 2016-01-23 DIAGNOSIS — M1990 Unspecified osteoarthritis, unspecified site: Secondary | ICD-10-CM | POA: Diagnosis present

## 2016-01-23 DIAGNOSIS — J9601 Acute respiratory failure with hypoxia: Secondary | ICD-10-CM | POA: Diagnosis not present

## 2016-01-23 DIAGNOSIS — N400 Enlarged prostate without lower urinary tract symptoms: Secondary | ICD-10-CM | POA: Diagnosis present

## 2016-01-23 DIAGNOSIS — I10 Essential (primary) hypertension: Secondary | ICD-10-CM | POA: Diagnosis present

## 2016-01-23 DIAGNOSIS — I480 Paroxysmal atrial fibrillation: Secondary | ICD-10-CM | POA: Diagnosis not present

## 2016-01-23 DIAGNOSIS — Z79899 Other long term (current) drug therapy: Secondary | ICD-10-CM | POA: Diagnosis not present

## 2016-01-23 DIAGNOSIS — R0602 Shortness of breath: Secondary | ICD-10-CM | POA: Diagnosis not present

## 2016-01-23 DIAGNOSIS — E7800 Pure hypercholesterolemia, unspecified: Secondary | ICD-10-CM | POA: Diagnosis present

## 2016-01-23 DIAGNOSIS — F419 Anxiety disorder, unspecified: Secondary | ICD-10-CM | POA: Diagnosis present

## 2016-01-23 DIAGNOSIS — J209 Acute bronchitis, unspecified: Secondary | ICD-10-CM | POA: Diagnosis not present

## 2016-01-26 DIAGNOSIS — R0789 Other chest pain: Secondary | ICD-10-CM | POA: Diagnosis not present

## 2016-01-26 DIAGNOSIS — T17890A Other foreign object in other parts of respiratory tract causing asphyxiation, initial encounter: Secondary | ICD-10-CM | POA: Diagnosis not present

## 2016-01-26 DIAGNOSIS — R079 Chest pain, unspecified: Secondary | ICD-10-CM | POA: Diagnosis not present

## 2016-01-26 DIAGNOSIS — R072 Precordial pain: Secondary | ICD-10-CM | POA: Diagnosis not present

## 2016-01-26 DIAGNOSIS — J9809 Other diseases of bronchus, not elsewhere classified: Secondary | ICD-10-CM | POA: Diagnosis not present

## 2016-02-03 DIAGNOSIS — M25551 Pain in right hip: Secondary | ICD-10-CM | POA: Diagnosis not present

## 2016-02-03 DIAGNOSIS — J449 Chronic obstructive pulmonary disease, unspecified: Secondary | ICD-10-CM | POA: Diagnosis not present

## 2016-02-08 DIAGNOSIS — M7061 Trochanteric bursitis, right hip: Secondary | ICD-10-CM | POA: Diagnosis not present

## 2016-02-08 DIAGNOSIS — M545 Low back pain: Secondary | ICD-10-CM | POA: Diagnosis not present

## 2016-03-01 DIAGNOSIS — M9902 Segmental and somatic dysfunction of thoracic region: Secondary | ICD-10-CM | POA: Diagnosis not present

## 2016-03-01 DIAGNOSIS — M5387 Other specified dorsopathies, lumbosacral region: Secondary | ICD-10-CM | POA: Diagnosis not present

## 2016-03-01 DIAGNOSIS — M9904 Segmental and somatic dysfunction of sacral region: Secondary | ICD-10-CM | POA: Diagnosis not present

## 2016-03-01 DIAGNOSIS — M9903 Segmental and somatic dysfunction of lumbar region: Secondary | ICD-10-CM | POA: Diagnosis not present

## 2016-03-01 DIAGNOSIS — G5701 Lesion of sciatic nerve, right lower limb: Secondary | ICD-10-CM | POA: Diagnosis not present

## 2016-03-06 DIAGNOSIS — G5701 Lesion of sciatic nerve, right lower limb: Secondary | ICD-10-CM | POA: Diagnosis not present

## 2016-03-06 DIAGNOSIS — M5387 Other specified dorsopathies, lumbosacral region: Secondary | ICD-10-CM | POA: Diagnosis not present

## 2016-03-06 DIAGNOSIS — M9903 Segmental and somatic dysfunction of lumbar region: Secondary | ICD-10-CM | POA: Diagnosis not present

## 2016-03-06 DIAGNOSIS — M9904 Segmental and somatic dysfunction of sacral region: Secondary | ICD-10-CM | POA: Diagnosis not present

## 2016-03-06 DIAGNOSIS — M9902 Segmental and somatic dysfunction of thoracic region: Secondary | ICD-10-CM | POA: Diagnosis not present

## 2016-03-07 DIAGNOSIS — M9902 Segmental and somatic dysfunction of thoracic region: Secondary | ICD-10-CM | POA: Diagnosis not present

## 2016-03-07 DIAGNOSIS — M5387 Other specified dorsopathies, lumbosacral region: Secondary | ICD-10-CM | POA: Diagnosis not present

## 2016-03-07 DIAGNOSIS — G5701 Lesion of sciatic nerve, right lower limb: Secondary | ICD-10-CM | POA: Diagnosis not present

## 2016-03-07 DIAGNOSIS — M9904 Segmental and somatic dysfunction of sacral region: Secondary | ICD-10-CM | POA: Diagnosis not present

## 2016-03-07 DIAGNOSIS — M9903 Segmental and somatic dysfunction of lumbar region: Secondary | ICD-10-CM | POA: Diagnosis not present

## 2016-03-12 DIAGNOSIS — M9904 Segmental and somatic dysfunction of sacral region: Secondary | ICD-10-CM | POA: Diagnosis not present

## 2016-03-12 DIAGNOSIS — M9902 Segmental and somatic dysfunction of thoracic region: Secondary | ICD-10-CM | POA: Diagnosis not present

## 2016-03-12 DIAGNOSIS — M9903 Segmental and somatic dysfunction of lumbar region: Secondary | ICD-10-CM | POA: Diagnosis not present

## 2016-03-12 DIAGNOSIS — M5387 Other specified dorsopathies, lumbosacral region: Secondary | ICD-10-CM | POA: Diagnosis not present

## 2016-03-12 DIAGNOSIS — G5701 Lesion of sciatic nerve, right lower limb: Secondary | ICD-10-CM | POA: Diagnosis not present

## 2016-03-16 DIAGNOSIS — M9902 Segmental and somatic dysfunction of thoracic region: Secondary | ICD-10-CM | POA: Diagnosis not present

## 2016-03-16 DIAGNOSIS — M9904 Segmental and somatic dysfunction of sacral region: Secondary | ICD-10-CM | POA: Diagnosis not present

## 2016-03-16 DIAGNOSIS — M5387 Other specified dorsopathies, lumbosacral region: Secondary | ICD-10-CM | POA: Diagnosis not present

## 2016-03-16 DIAGNOSIS — M9903 Segmental and somatic dysfunction of lumbar region: Secondary | ICD-10-CM | POA: Diagnosis not present

## 2016-03-16 DIAGNOSIS — G5701 Lesion of sciatic nerve, right lower limb: Secondary | ICD-10-CM | POA: Diagnosis not present

## 2016-03-19 DIAGNOSIS — M9904 Segmental and somatic dysfunction of sacral region: Secondary | ICD-10-CM | POA: Diagnosis not present

## 2016-03-19 DIAGNOSIS — M9902 Segmental and somatic dysfunction of thoracic region: Secondary | ICD-10-CM | POA: Diagnosis not present

## 2016-03-19 DIAGNOSIS — G5701 Lesion of sciatic nerve, right lower limb: Secondary | ICD-10-CM | POA: Diagnosis not present

## 2016-03-19 DIAGNOSIS — M5387 Other specified dorsopathies, lumbosacral region: Secondary | ICD-10-CM | POA: Diagnosis not present

## 2016-03-19 DIAGNOSIS — M9903 Segmental and somatic dysfunction of lumbar region: Secondary | ICD-10-CM | POA: Diagnosis not present

## 2016-03-26 DIAGNOSIS — M9903 Segmental and somatic dysfunction of lumbar region: Secondary | ICD-10-CM | POA: Diagnosis not present

## 2016-03-26 DIAGNOSIS — G5701 Lesion of sciatic nerve, right lower limb: Secondary | ICD-10-CM | POA: Diagnosis not present

## 2016-03-26 DIAGNOSIS — M9902 Segmental and somatic dysfunction of thoracic region: Secondary | ICD-10-CM | POA: Diagnosis not present

## 2016-03-26 DIAGNOSIS — M9904 Segmental and somatic dysfunction of sacral region: Secondary | ICD-10-CM | POA: Diagnosis not present

## 2016-03-26 DIAGNOSIS — M5387 Other specified dorsopathies, lumbosacral region: Secondary | ICD-10-CM | POA: Diagnosis not present

## 2016-03-29 DIAGNOSIS — M9903 Segmental and somatic dysfunction of lumbar region: Secondary | ICD-10-CM | POA: Diagnosis not present

## 2016-03-29 DIAGNOSIS — M9902 Segmental and somatic dysfunction of thoracic region: Secondary | ICD-10-CM | POA: Diagnosis not present

## 2016-03-29 DIAGNOSIS — M5387 Other specified dorsopathies, lumbosacral region: Secondary | ICD-10-CM | POA: Diagnosis not present

## 2016-03-29 DIAGNOSIS — M9904 Segmental and somatic dysfunction of sacral region: Secondary | ICD-10-CM | POA: Diagnosis not present

## 2016-03-29 DIAGNOSIS — G5701 Lesion of sciatic nerve, right lower limb: Secondary | ICD-10-CM | POA: Diagnosis not present

## 2016-04-02 DIAGNOSIS — M9903 Segmental and somatic dysfunction of lumbar region: Secondary | ICD-10-CM | POA: Diagnosis not present

## 2016-04-02 DIAGNOSIS — M5387 Other specified dorsopathies, lumbosacral region: Secondary | ICD-10-CM | POA: Diagnosis not present

## 2016-04-02 DIAGNOSIS — G5701 Lesion of sciatic nerve, right lower limb: Secondary | ICD-10-CM | POA: Diagnosis not present

## 2016-04-02 DIAGNOSIS — M9902 Segmental and somatic dysfunction of thoracic region: Secondary | ICD-10-CM | POA: Diagnosis not present

## 2016-04-02 DIAGNOSIS — M9904 Segmental and somatic dysfunction of sacral region: Secondary | ICD-10-CM | POA: Diagnosis not present

## 2016-04-05 DIAGNOSIS — M5387 Other specified dorsopathies, lumbosacral region: Secondary | ICD-10-CM | POA: Diagnosis not present

## 2016-04-05 DIAGNOSIS — G5701 Lesion of sciatic nerve, right lower limb: Secondary | ICD-10-CM | POA: Diagnosis not present

## 2016-04-05 DIAGNOSIS — M9904 Segmental and somatic dysfunction of sacral region: Secondary | ICD-10-CM | POA: Diagnosis not present

## 2016-04-05 DIAGNOSIS — M9903 Segmental and somatic dysfunction of lumbar region: Secondary | ICD-10-CM | POA: Diagnosis not present

## 2016-04-05 DIAGNOSIS — M9902 Segmental and somatic dysfunction of thoracic region: Secondary | ICD-10-CM | POA: Diagnosis not present

## 2016-04-09 DIAGNOSIS — M9903 Segmental and somatic dysfunction of lumbar region: Secondary | ICD-10-CM | POA: Diagnosis not present

## 2016-04-09 DIAGNOSIS — M9904 Segmental and somatic dysfunction of sacral region: Secondary | ICD-10-CM | POA: Diagnosis not present

## 2016-04-09 DIAGNOSIS — M9902 Segmental and somatic dysfunction of thoracic region: Secondary | ICD-10-CM | POA: Diagnosis not present

## 2016-04-09 DIAGNOSIS — G5701 Lesion of sciatic nerve, right lower limb: Secondary | ICD-10-CM | POA: Diagnosis not present

## 2016-04-09 DIAGNOSIS — M5387 Other specified dorsopathies, lumbosacral region: Secondary | ICD-10-CM | POA: Diagnosis not present

## 2016-04-12 DIAGNOSIS — J111 Influenza due to unidentified influenza virus with other respiratory manifestations: Secondary | ICD-10-CM | POA: Diagnosis not present

## 2016-04-15 DIAGNOSIS — N3090 Cystitis, unspecified without hematuria: Secondary | ICD-10-CM | POA: Diagnosis not present

## 2016-04-15 DIAGNOSIS — R300 Dysuria: Secondary | ICD-10-CM | POA: Diagnosis not present

## 2016-05-02 DIAGNOSIS — M5387 Other specified dorsopathies, lumbosacral region: Secondary | ICD-10-CM | POA: Diagnosis not present

## 2016-05-02 DIAGNOSIS — M9902 Segmental and somatic dysfunction of thoracic region: Secondary | ICD-10-CM | POA: Diagnosis not present

## 2016-05-02 DIAGNOSIS — G5701 Lesion of sciatic nerve, right lower limb: Secondary | ICD-10-CM | POA: Diagnosis not present

## 2016-05-02 DIAGNOSIS — M9903 Segmental and somatic dysfunction of lumbar region: Secondary | ICD-10-CM | POA: Diagnosis not present

## 2016-05-02 DIAGNOSIS — M9904 Segmental and somatic dysfunction of sacral region: Secondary | ICD-10-CM | POA: Diagnosis not present

## 2016-05-09 DIAGNOSIS — M9902 Segmental and somatic dysfunction of thoracic region: Secondary | ICD-10-CM | POA: Diagnosis not present

## 2016-05-09 DIAGNOSIS — M9904 Segmental and somatic dysfunction of sacral region: Secondary | ICD-10-CM | POA: Diagnosis not present

## 2016-05-09 DIAGNOSIS — M5387 Other specified dorsopathies, lumbosacral region: Secondary | ICD-10-CM | POA: Diagnosis not present

## 2016-05-09 DIAGNOSIS — M9903 Segmental and somatic dysfunction of lumbar region: Secondary | ICD-10-CM | POA: Diagnosis not present

## 2016-05-09 DIAGNOSIS — G5701 Lesion of sciatic nerve, right lower limb: Secondary | ICD-10-CM | POA: Diagnosis not present

## 2016-05-15 DIAGNOSIS — M150 Primary generalized (osteo)arthritis: Secondary | ICD-10-CM | POA: Diagnosis not present

## 2016-05-15 DIAGNOSIS — J209 Acute bronchitis, unspecified: Secondary | ICD-10-CM | POA: Diagnosis not present

## 2016-08-01 DIAGNOSIS — I5022 Chronic systolic (congestive) heart failure: Secondary | ICD-10-CM | POA: Diagnosis not present

## 2016-08-01 DIAGNOSIS — I110 Hypertensive heart disease with heart failure: Secondary | ICD-10-CM | POA: Diagnosis not present

## 2016-08-01 DIAGNOSIS — F419 Anxiety disorder, unspecified: Secondary | ICD-10-CM | POA: Diagnosis not present

## 2016-08-01 DIAGNOSIS — M25512 Pain in left shoulder: Secondary | ICD-10-CM | POA: Diagnosis not present

## 2016-08-01 DIAGNOSIS — I10 Essential (primary) hypertension: Secondary | ICD-10-CM | POA: Diagnosis not present

## 2016-08-01 DIAGNOSIS — E1165 Type 2 diabetes mellitus with hyperglycemia: Secondary | ICD-10-CM | POA: Diagnosis not present

## 2016-08-01 DIAGNOSIS — J449 Chronic obstructive pulmonary disease, unspecified: Secondary | ICD-10-CM | POA: Diagnosis not present

## 2016-08-01 DIAGNOSIS — N401 Enlarged prostate with lower urinary tract symptoms: Secondary | ICD-10-CM | POA: Diagnosis not present

## 2016-08-01 DIAGNOSIS — Z0000 Encounter for general adult medical examination without abnormal findings: Secondary | ICD-10-CM | POA: Diagnosis not present

## 2016-08-01 DIAGNOSIS — F0390 Unspecified dementia without behavioral disturbance: Secondary | ICD-10-CM | POA: Diagnosis not present

## 2016-08-01 DIAGNOSIS — N138 Other obstructive and reflux uropathy: Secondary | ICD-10-CM | POA: Diagnosis not present

## 2016-08-01 DIAGNOSIS — G629 Polyneuropathy, unspecified: Secondary | ICD-10-CM | POA: Diagnosis not present

## 2016-08-01 DIAGNOSIS — M25511 Pain in right shoulder: Secondary | ICD-10-CM | POA: Diagnosis not present

## 2016-08-01 DIAGNOSIS — E782 Mixed hyperlipidemia: Secondary | ICD-10-CM | POA: Diagnosis not present

## 2016-10-01 DIAGNOSIS — J449 Chronic obstructive pulmonary disease, unspecified: Secondary | ICD-10-CM | POA: Diagnosis not present

## 2016-10-29 DIAGNOSIS — J441 Chronic obstructive pulmonary disease with (acute) exacerbation: Secondary | ICD-10-CM | POA: Diagnosis not present

## 2016-12-10 DIAGNOSIS — Z23 Encounter for immunization: Secondary | ICD-10-CM | POA: Diagnosis not present

## 2017-01-21 DIAGNOSIS — R5383 Other fatigue: Secondary | ICD-10-CM | POA: Diagnosis not present

## 2017-01-21 DIAGNOSIS — J310 Chronic rhinitis: Secondary | ICD-10-CM | POA: Diagnosis not present

## 2017-01-21 DIAGNOSIS — G4733 Obstructive sleep apnea (adult) (pediatric): Secondary | ICD-10-CM | POA: Diagnosis not present

## 2017-01-21 DIAGNOSIS — J441 Chronic obstructive pulmonary disease with (acute) exacerbation: Secondary | ICD-10-CM | POA: Diagnosis not present

## 2017-01-28 DIAGNOSIS — J449 Chronic obstructive pulmonary disease, unspecified: Secondary | ICD-10-CM | POA: Diagnosis not present

## 2017-01-28 DIAGNOSIS — Z1211 Encounter for screening for malignant neoplasm of colon: Secondary | ICD-10-CM | POA: Diagnosis not present

## 2017-01-28 DIAGNOSIS — N401 Enlarged prostate with lower urinary tract symptoms: Secondary | ICD-10-CM | POA: Diagnosis not present

## 2017-01-28 DIAGNOSIS — I10 Essential (primary) hypertension: Secondary | ICD-10-CM | POA: Diagnosis not present

## 2017-01-28 DIAGNOSIS — Z1159 Encounter for screening for other viral diseases: Secondary | ICD-10-CM | POA: Diagnosis not present

## 2017-01-28 DIAGNOSIS — F0390 Unspecified dementia without behavioral disturbance: Secondary | ICD-10-CM | POA: Diagnosis not present

## 2017-01-28 DIAGNOSIS — E1165 Type 2 diabetes mellitus with hyperglycemia: Secondary | ICD-10-CM | POA: Diagnosis not present

## 2017-01-28 DIAGNOSIS — E782 Mixed hyperlipidemia: Secondary | ICD-10-CM | POA: Diagnosis not present

## 2017-01-28 DIAGNOSIS — N138 Other obstructive and reflux uropathy: Secondary | ICD-10-CM | POA: Diagnosis not present

## 2017-01-29 DIAGNOSIS — J441 Chronic obstructive pulmonary disease with (acute) exacerbation: Secondary | ICD-10-CM | POA: Diagnosis not present

## 2017-01-30 DIAGNOSIS — J441 Chronic obstructive pulmonary disease with (acute) exacerbation: Secondary | ICD-10-CM | POA: Diagnosis not present

## 2017-02-05 DIAGNOSIS — F411 Generalized anxiety disorder: Secondary | ICD-10-CM | POA: Diagnosis not present

## 2017-02-05 DIAGNOSIS — J449 Chronic obstructive pulmonary disease, unspecified: Secondary | ICD-10-CM | POA: Diagnosis not present

## 2017-02-05 DIAGNOSIS — J310 Chronic rhinitis: Secondary | ICD-10-CM | POA: Diagnosis not present

## 2017-02-05 DIAGNOSIS — G4733 Obstructive sleep apnea (adult) (pediatric): Secondary | ICD-10-CM | POA: Diagnosis not present

## 2017-02-08 DIAGNOSIS — R51 Headache: Secondary | ICD-10-CM | POA: Diagnosis not present

## 2017-05-02 DIAGNOSIS — K219 Gastro-esophageal reflux disease without esophagitis: Secondary | ICD-10-CM | POA: Diagnosis not present

## 2017-05-02 DIAGNOSIS — M5416 Radiculopathy, lumbar region: Secondary | ICD-10-CM | POA: Diagnosis not present

## 2017-05-02 DIAGNOSIS — I5022 Chronic systolic (congestive) heart failure: Secondary | ICD-10-CM | POA: Diagnosis not present

## 2017-05-02 DIAGNOSIS — R05 Cough: Secondary | ICD-10-CM | POA: Diagnosis not present

## 2017-05-02 DIAGNOSIS — J449 Chronic obstructive pulmonary disease, unspecified: Secondary | ICD-10-CM | POA: Diagnosis not present

## 2017-05-02 DIAGNOSIS — M5136 Other intervertebral disc degeneration, lumbar region: Secondary | ICD-10-CM | POA: Diagnosis not present

## 2017-05-08 DIAGNOSIS — M5116 Intervertebral disc disorders with radiculopathy, lumbar region: Secondary | ICD-10-CM | POA: Diagnosis not present

## 2017-05-23 DIAGNOSIS — M48062 Spinal stenosis, lumbar region with neurogenic claudication: Secondary | ICD-10-CM | POA: Diagnosis not present

## 2017-05-23 DIAGNOSIS — I10 Essential (primary) hypertension: Secondary | ICD-10-CM | POA: Diagnosis not present

## 2017-05-23 DIAGNOSIS — M47816 Spondylosis without myelopathy or radiculopathy, lumbar region: Secondary | ICD-10-CM | POA: Diagnosis not present

## 2017-05-23 DIAGNOSIS — Z6832 Body mass index (BMI) 32.0-32.9, adult: Secondary | ICD-10-CM | POA: Diagnosis not present

## 2017-06-11 DIAGNOSIS — M47816 Spondylosis without myelopathy or radiculopathy, lumbar region: Secondary | ICD-10-CM | POA: Diagnosis not present

## 2017-06-20 DIAGNOSIS — M48062 Spinal stenosis, lumbar region with neurogenic claudication: Secondary | ICD-10-CM | POA: Diagnosis not present

## 2017-06-20 DIAGNOSIS — M47816 Spondylosis without myelopathy or radiculopathy, lumbar region: Secondary | ICD-10-CM | POA: Diagnosis not present

## 2017-06-28 DIAGNOSIS — R972 Elevated prostate specific antigen [PSA]: Secondary | ICD-10-CM | POA: Diagnosis not present

## 2017-06-28 DIAGNOSIS — N400 Enlarged prostate without lower urinary tract symptoms: Secondary | ICD-10-CM | POA: Diagnosis not present

## 2017-06-28 DIAGNOSIS — R310 Gross hematuria: Secondary | ICD-10-CM | POA: Diagnosis not present

## 2017-06-28 DIAGNOSIS — N3091 Cystitis, unspecified with hematuria: Secondary | ICD-10-CM | POA: Diagnosis not present

## 2017-07-12 DIAGNOSIS — R609 Edema, unspecified: Secondary | ICD-10-CM | POA: Diagnosis not present

## 2017-07-12 DIAGNOSIS — E1165 Type 2 diabetes mellitus with hyperglycemia: Secondary | ICD-10-CM | POA: Diagnosis not present

## 2017-07-12 DIAGNOSIS — E782 Mixed hyperlipidemia: Secondary | ICD-10-CM | POA: Diagnosis not present

## 2017-07-12 DIAGNOSIS — I10 Essential (primary) hypertension: Secondary | ICD-10-CM | POA: Diagnosis not present

## 2017-07-12 DIAGNOSIS — I440 Atrioventricular block, first degree: Secondary | ICD-10-CM | POA: Diagnosis not present

## 2017-07-12 DIAGNOSIS — R0600 Dyspnea, unspecified: Secondary | ICD-10-CM | POA: Diagnosis not present

## 2017-07-12 DIAGNOSIS — Z01818 Encounter for other preprocedural examination: Secondary | ICD-10-CM | POA: Diagnosis not present

## 2017-07-12 DIAGNOSIS — N390 Urinary tract infection, site not specified: Secondary | ICD-10-CM | POA: Diagnosis not present

## 2017-07-12 DIAGNOSIS — I447 Left bundle-branch block, unspecified: Secondary | ICD-10-CM | POA: Diagnosis not present

## 2017-07-19 DIAGNOSIS — I10 Essential (primary) hypertension: Secondary | ICD-10-CM | POA: Diagnosis not present

## 2017-07-19 DIAGNOSIS — M48062 Spinal stenosis, lumbar region with neurogenic claudication: Secondary | ICD-10-CM | POA: Diagnosis not present

## 2017-07-19 DIAGNOSIS — Z6832 Body mass index (BMI) 32.0-32.9, adult: Secondary | ICD-10-CM | POA: Diagnosis not present

## 2017-07-19 DIAGNOSIS — Z4689 Encounter for fitting and adjustment of other specified devices: Secondary | ICD-10-CM | POA: Diagnosis not present

## 2017-07-30 DIAGNOSIS — F0390 Unspecified dementia without behavioral disturbance: Secondary | ICD-10-CM | POA: Diagnosis not present

## 2017-07-30 DIAGNOSIS — R609 Edema, unspecified: Secondary | ICD-10-CM | POA: Diagnosis not present

## 2017-07-30 DIAGNOSIS — J3489 Other specified disorders of nose and nasal sinuses: Secondary | ICD-10-CM | POA: Diagnosis not present

## 2017-07-30 DIAGNOSIS — I10 Essential (primary) hypertension: Secondary | ICD-10-CM | POA: Diagnosis not present

## 2017-07-30 DIAGNOSIS — R0689 Other abnormalities of breathing: Secondary | ICD-10-CM | POA: Diagnosis not present

## 2017-07-30 DIAGNOSIS — E1165 Type 2 diabetes mellitus with hyperglycemia: Secondary | ICD-10-CM | POA: Diagnosis not present

## 2017-07-31 DIAGNOSIS — M48061 Spinal stenosis, lumbar region without neurogenic claudication: Secondary | ICD-10-CM | POA: Diagnosis not present

## 2017-07-31 DIAGNOSIS — Z01812 Encounter for preprocedural laboratory examination: Secondary | ICD-10-CM | POA: Diagnosis not present

## 2017-07-31 DIAGNOSIS — G4733 Obstructive sleep apnea (adult) (pediatric): Secondary | ICD-10-CM | POA: Diagnosis not present

## 2017-07-31 DIAGNOSIS — I110 Hypertensive heart disease with heart failure: Secondary | ICD-10-CM | POA: Diagnosis not present

## 2017-07-31 DIAGNOSIS — J432 Centrilobular emphysema: Secondary | ICD-10-CM | POA: Diagnosis not present

## 2017-07-31 DIAGNOSIS — I4891 Unspecified atrial fibrillation: Secondary | ICD-10-CM | POA: Diagnosis not present

## 2017-07-31 DIAGNOSIS — I5022 Chronic systolic (congestive) heart failure: Secondary | ICD-10-CM | POA: Diagnosis not present

## 2017-08-06 DIAGNOSIS — F0390 Unspecified dementia without behavioral disturbance: Secondary | ICD-10-CM | POA: Diagnosis present

## 2017-08-06 DIAGNOSIS — Z7409 Other reduced mobility: Secondary | ICD-10-CM | POA: Diagnosis not present

## 2017-08-06 DIAGNOSIS — G9741 Accidental puncture or laceration of dura during a procedure: Secondary | ICD-10-CM | POA: Diagnosis not present

## 2017-08-06 DIAGNOSIS — Z79899 Other long term (current) drug therapy: Secondary | ICD-10-CM | POA: Diagnosis not present

## 2017-08-06 DIAGNOSIS — E782 Mixed hyperlipidemia: Secondary | ICD-10-CM | POA: Diagnosis present

## 2017-08-06 DIAGNOSIS — Z981 Arthrodesis status: Secondary | ICD-10-CM | POA: Diagnosis not present

## 2017-08-06 DIAGNOSIS — I110 Hypertensive heart disease with heart failure: Secondary | ICD-10-CM | POA: Diagnosis present

## 2017-08-06 DIAGNOSIS — M549 Dorsalgia, unspecified: Secondary | ICD-10-CM | POA: Diagnosis not present

## 2017-08-06 DIAGNOSIS — Z4789 Encounter for other orthopedic aftercare: Secondary | ICD-10-CM | POA: Diagnosis not present

## 2017-08-06 DIAGNOSIS — J449 Chronic obstructive pulmonary disease, unspecified: Secondary | ICD-10-CM | POA: Diagnosis not present

## 2017-08-06 DIAGNOSIS — R279 Unspecified lack of coordination: Secondary | ICD-10-CM | POA: Diagnosis not present

## 2017-08-06 DIAGNOSIS — I2510 Atherosclerotic heart disease of native coronary artery without angina pectoris: Secondary | ICD-10-CM | POA: Diagnosis present

## 2017-08-06 DIAGNOSIS — M48062 Spinal stenosis, lumbar region with neurogenic claudication: Secondary | ICD-10-CM | POA: Diagnosis not present

## 2017-08-06 DIAGNOSIS — M47896 Other spondylosis, lumbar region: Secondary | ICD-10-CM | POA: Diagnosis not present

## 2017-08-06 DIAGNOSIS — M4726 Other spondylosis with radiculopathy, lumbar region: Secondary | ICD-10-CM | POA: Diagnosis present

## 2017-08-06 DIAGNOSIS — I509 Heart failure, unspecified: Secondary | ICD-10-CM | POA: Diagnosis present

## 2017-08-09 DIAGNOSIS — Z471 Aftercare following joint replacement surgery: Secondary | ICD-10-CM | POA: Diagnosis not present

## 2017-08-09 DIAGNOSIS — R279 Unspecified lack of coordination: Secondary | ICD-10-CM | POA: Diagnosis not present

## 2017-08-09 DIAGNOSIS — M48062 Spinal stenosis, lumbar region with neurogenic claudication: Secondary | ICD-10-CM | POA: Diagnosis not present

## 2017-08-09 DIAGNOSIS — Z7409 Other reduced mobility: Secondary | ICD-10-CM | POA: Diagnosis not present

## 2017-08-09 DIAGNOSIS — D649 Anemia, unspecified: Secondary | ICD-10-CM | POA: Diagnosis not present

## 2017-08-09 DIAGNOSIS — M47896 Other spondylosis, lumbar region: Secondary | ICD-10-CM | POA: Diagnosis not present

## 2017-08-09 DIAGNOSIS — Z4789 Encounter for other orthopedic aftercare: Secondary | ICD-10-CM | POA: Diagnosis not present

## 2017-08-09 DIAGNOSIS — M549 Dorsalgia, unspecified: Secondary | ICD-10-CM | POA: Diagnosis not present

## 2017-08-09 DIAGNOSIS — R262 Difficulty in walking, not elsewhere classified: Secondary | ICD-10-CM | POA: Diagnosis not present

## 2017-08-09 DIAGNOSIS — Z79899 Other long term (current) drug therapy: Secondary | ICD-10-CM | POA: Diagnosis not present

## 2017-08-09 DIAGNOSIS — F0390 Unspecified dementia without behavioral disturbance: Secondary | ICD-10-CM | POA: Diagnosis not present

## 2017-08-09 DIAGNOSIS — G8918 Other acute postprocedural pain: Secondary | ICD-10-CM | POA: Diagnosis not present

## 2017-08-09 DIAGNOSIS — J449 Chronic obstructive pulmonary disease, unspecified: Secondary | ICD-10-CM | POA: Diagnosis not present

## 2017-08-11 DIAGNOSIS — R262 Difficulty in walking, not elsewhere classified: Secondary | ICD-10-CM | POA: Diagnosis not present

## 2017-08-11 DIAGNOSIS — F0390 Unspecified dementia without behavioral disturbance: Secondary | ICD-10-CM | POA: Diagnosis not present

## 2017-08-11 DIAGNOSIS — G8918 Other acute postprocedural pain: Secondary | ICD-10-CM | POA: Diagnosis not present

## 2017-08-11 DIAGNOSIS — Z471 Aftercare following joint replacement surgery: Secondary | ICD-10-CM | POA: Diagnosis not present

## 2017-08-24 DIAGNOSIS — G300 Alzheimer's disease with early onset: Secondary | ICD-10-CM | POA: Diagnosis not present

## 2017-08-24 DIAGNOSIS — Z981 Arthrodesis status: Secondary | ICD-10-CM | POA: Diagnosis not present

## 2017-08-24 DIAGNOSIS — I110 Hypertensive heart disease with heart failure: Secondary | ICD-10-CM | POA: Diagnosis not present

## 2017-08-24 DIAGNOSIS — F419 Anxiety disorder, unspecified: Secondary | ICD-10-CM | POA: Diagnosis not present

## 2017-08-24 DIAGNOSIS — M48062 Spinal stenosis, lumbar region with neurogenic claudication: Secondary | ICD-10-CM | POA: Diagnosis not present

## 2017-08-24 DIAGNOSIS — N400 Enlarged prostate without lower urinary tract symptoms: Secondary | ICD-10-CM | POA: Diagnosis not present

## 2017-08-24 DIAGNOSIS — I509 Heart failure, unspecified: Secondary | ICD-10-CM | POA: Diagnosis not present

## 2017-08-24 DIAGNOSIS — E785 Hyperlipidemia, unspecified: Secondary | ICD-10-CM | POA: Diagnosis not present

## 2017-08-24 DIAGNOSIS — M47896 Other spondylosis, lumbar region: Secondary | ICD-10-CM | POA: Diagnosis not present

## 2017-08-24 DIAGNOSIS — G4733 Obstructive sleep apnea (adult) (pediatric): Secondary | ICD-10-CM | POA: Diagnosis not present

## 2017-08-24 DIAGNOSIS — I442 Atrioventricular block, complete: Secondary | ICD-10-CM | POA: Diagnosis not present

## 2017-08-24 DIAGNOSIS — Z79899 Other long term (current) drug therapy: Secondary | ICD-10-CM | POA: Diagnosis not present

## 2017-08-24 DIAGNOSIS — F0280 Dementia in other diseases classified elsewhere without behavioral disturbance: Secondary | ICD-10-CM | POA: Diagnosis not present

## 2017-08-24 DIAGNOSIS — J439 Emphysema, unspecified: Secondary | ICD-10-CM | POA: Diagnosis not present

## 2017-08-24 DIAGNOSIS — Z4789 Encounter for other orthopedic aftercare: Secondary | ICD-10-CM | POA: Diagnosis not present

## 2017-08-27 DIAGNOSIS — Z4789 Encounter for other orthopedic aftercare: Secondary | ICD-10-CM | POA: Diagnosis not present

## 2017-08-27 DIAGNOSIS — M47896 Other spondylosis, lumbar region: Secondary | ICD-10-CM | POA: Diagnosis not present

## 2017-08-27 DIAGNOSIS — J439 Emphysema, unspecified: Secondary | ICD-10-CM | POA: Diagnosis not present

## 2017-08-27 DIAGNOSIS — M48062 Spinal stenosis, lumbar region with neurogenic claudication: Secondary | ICD-10-CM | POA: Diagnosis not present

## 2017-08-27 DIAGNOSIS — I509 Heart failure, unspecified: Secondary | ICD-10-CM | POA: Diagnosis not present

## 2017-08-27 DIAGNOSIS — I110 Hypertensive heart disease with heart failure: Secondary | ICD-10-CM | POA: Diagnosis not present

## 2017-08-28 DIAGNOSIS — J439 Emphysema, unspecified: Secondary | ICD-10-CM | POA: Diagnosis not present

## 2017-08-28 DIAGNOSIS — M47896 Other spondylosis, lumbar region: Secondary | ICD-10-CM | POA: Diagnosis not present

## 2017-08-28 DIAGNOSIS — M48062 Spinal stenosis, lumbar region with neurogenic claudication: Secondary | ICD-10-CM | POA: Diagnosis not present

## 2017-08-28 DIAGNOSIS — Z4789 Encounter for other orthopedic aftercare: Secondary | ICD-10-CM | POA: Diagnosis not present

## 2017-08-28 DIAGNOSIS — I509 Heart failure, unspecified: Secondary | ICD-10-CM | POA: Diagnosis not present

## 2017-08-28 DIAGNOSIS — I110 Hypertensive heart disease with heart failure: Secondary | ICD-10-CM | POA: Diagnosis not present

## 2017-08-29 DIAGNOSIS — I110 Hypertensive heart disease with heart failure: Secondary | ICD-10-CM | POA: Diagnosis not present

## 2017-08-29 DIAGNOSIS — M48062 Spinal stenosis, lumbar region with neurogenic claudication: Secondary | ICD-10-CM | POA: Diagnosis not present

## 2017-08-29 DIAGNOSIS — M47896 Other spondylosis, lumbar region: Secondary | ICD-10-CM | POA: Diagnosis not present

## 2017-08-29 DIAGNOSIS — I509 Heart failure, unspecified: Secondary | ICD-10-CM | POA: Diagnosis not present

## 2017-08-29 DIAGNOSIS — Z4789 Encounter for other orthopedic aftercare: Secondary | ICD-10-CM | POA: Diagnosis not present

## 2017-08-29 DIAGNOSIS — J439 Emphysema, unspecified: Secondary | ICD-10-CM | POA: Diagnosis not present

## 2017-09-01 DIAGNOSIS — I110 Hypertensive heart disease with heart failure: Secondary | ICD-10-CM | POA: Diagnosis not present

## 2017-09-01 DIAGNOSIS — Z4789 Encounter for other orthopedic aftercare: Secondary | ICD-10-CM | POA: Diagnosis not present

## 2017-09-01 DIAGNOSIS — M47896 Other spondylosis, lumbar region: Secondary | ICD-10-CM | POA: Diagnosis not present

## 2017-09-01 DIAGNOSIS — I509 Heart failure, unspecified: Secondary | ICD-10-CM | POA: Diagnosis not present

## 2017-09-01 DIAGNOSIS — M48062 Spinal stenosis, lumbar region with neurogenic claudication: Secondary | ICD-10-CM | POA: Diagnosis not present

## 2017-09-01 DIAGNOSIS — J439 Emphysema, unspecified: Secondary | ICD-10-CM | POA: Diagnosis not present

## 2017-09-02 DIAGNOSIS — J439 Emphysema, unspecified: Secondary | ICD-10-CM | POA: Diagnosis not present

## 2017-09-02 DIAGNOSIS — I110 Hypertensive heart disease with heart failure: Secondary | ICD-10-CM | POA: Diagnosis not present

## 2017-09-02 DIAGNOSIS — I509 Heart failure, unspecified: Secondary | ICD-10-CM | POA: Diagnosis not present

## 2017-09-02 DIAGNOSIS — M47896 Other spondylosis, lumbar region: Secondary | ICD-10-CM | POA: Diagnosis not present

## 2017-09-02 DIAGNOSIS — Z4789 Encounter for other orthopedic aftercare: Secondary | ICD-10-CM | POA: Diagnosis not present

## 2017-09-02 DIAGNOSIS — M48062 Spinal stenosis, lumbar region with neurogenic claudication: Secondary | ICD-10-CM | POA: Diagnosis not present

## 2017-09-03 DIAGNOSIS — J441 Chronic obstructive pulmonary disease with (acute) exacerbation: Secondary | ICD-10-CM | POA: Diagnosis not present

## 2017-09-03 DIAGNOSIS — N3001 Acute cystitis with hematuria: Secondary | ICD-10-CM | POA: Diagnosis not present

## 2017-09-04 DIAGNOSIS — J439 Emphysema, unspecified: Secondary | ICD-10-CM | POA: Diagnosis not present

## 2017-09-04 DIAGNOSIS — M48062 Spinal stenosis, lumbar region with neurogenic claudication: Secondary | ICD-10-CM | POA: Diagnosis not present

## 2017-09-04 DIAGNOSIS — M47896 Other spondylosis, lumbar region: Secondary | ICD-10-CM | POA: Diagnosis not present

## 2017-09-04 DIAGNOSIS — I110 Hypertensive heart disease with heart failure: Secondary | ICD-10-CM | POA: Diagnosis not present

## 2017-09-04 DIAGNOSIS — Z4789 Encounter for other orthopedic aftercare: Secondary | ICD-10-CM | POA: Diagnosis not present

## 2017-09-04 DIAGNOSIS — I509 Heart failure, unspecified: Secondary | ICD-10-CM | POA: Diagnosis not present

## 2017-09-05 DIAGNOSIS — R0789 Other chest pain: Secondary | ICD-10-CM | POA: Diagnosis not present

## 2017-09-09 DIAGNOSIS — I509 Heart failure, unspecified: Secondary | ICD-10-CM | POA: Diagnosis not present

## 2017-09-09 DIAGNOSIS — J439 Emphysema, unspecified: Secondary | ICD-10-CM | POA: Diagnosis not present

## 2017-09-09 DIAGNOSIS — M47896 Other spondylosis, lumbar region: Secondary | ICD-10-CM | POA: Diagnosis not present

## 2017-09-09 DIAGNOSIS — I110 Hypertensive heart disease with heart failure: Secondary | ICD-10-CM | POA: Diagnosis not present

## 2017-09-09 DIAGNOSIS — Z4789 Encounter for other orthopedic aftercare: Secondary | ICD-10-CM | POA: Diagnosis not present

## 2017-09-09 DIAGNOSIS — M48062 Spinal stenosis, lumbar region with neurogenic claudication: Secondary | ICD-10-CM | POA: Diagnosis not present

## 2017-09-10 DIAGNOSIS — J439 Emphysema, unspecified: Secondary | ICD-10-CM | POA: Diagnosis not present

## 2017-09-10 DIAGNOSIS — M47896 Other spondylosis, lumbar region: Secondary | ICD-10-CM | POA: Diagnosis not present

## 2017-09-10 DIAGNOSIS — Z4789 Encounter for other orthopedic aftercare: Secondary | ICD-10-CM | POA: Diagnosis not present

## 2017-09-10 DIAGNOSIS — M48062 Spinal stenosis, lumbar region with neurogenic claudication: Secondary | ICD-10-CM | POA: Diagnosis not present

## 2017-09-10 DIAGNOSIS — I110 Hypertensive heart disease with heart failure: Secondary | ICD-10-CM | POA: Diagnosis not present

## 2017-09-10 DIAGNOSIS — I509 Heart failure, unspecified: Secondary | ICD-10-CM | POA: Diagnosis not present

## 2017-09-11 DIAGNOSIS — M48062 Spinal stenosis, lumbar region with neurogenic claudication: Secondary | ICD-10-CM | POA: Diagnosis not present

## 2017-09-11 DIAGNOSIS — J439 Emphysema, unspecified: Secondary | ICD-10-CM | POA: Diagnosis not present

## 2017-09-11 DIAGNOSIS — I509 Heart failure, unspecified: Secondary | ICD-10-CM | POA: Diagnosis not present

## 2017-09-11 DIAGNOSIS — Z4789 Encounter for other orthopedic aftercare: Secondary | ICD-10-CM | POA: Diagnosis not present

## 2017-09-11 DIAGNOSIS — I110 Hypertensive heart disease with heart failure: Secondary | ICD-10-CM | POA: Diagnosis not present

## 2017-09-11 DIAGNOSIS — M47896 Other spondylosis, lumbar region: Secondary | ICD-10-CM | POA: Diagnosis not present

## 2017-09-12 DIAGNOSIS — M48062 Spinal stenosis, lumbar region with neurogenic claudication: Secondary | ICD-10-CM | POA: Diagnosis not present

## 2017-09-12 DIAGNOSIS — Z4789 Encounter for other orthopedic aftercare: Secondary | ICD-10-CM | POA: Diagnosis not present

## 2017-09-12 DIAGNOSIS — I509 Heart failure, unspecified: Secondary | ICD-10-CM | POA: Diagnosis not present

## 2017-09-12 DIAGNOSIS — J439 Emphysema, unspecified: Secondary | ICD-10-CM | POA: Diagnosis not present

## 2017-09-12 DIAGNOSIS — I110 Hypertensive heart disease with heart failure: Secondary | ICD-10-CM | POA: Diagnosis not present

## 2017-09-12 DIAGNOSIS — M47896 Other spondylosis, lumbar region: Secondary | ICD-10-CM | POA: Diagnosis not present

## 2017-09-16 DIAGNOSIS — M47896 Other spondylosis, lumbar region: Secondary | ICD-10-CM | POA: Diagnosis not present

## 2017-09-16 DIAGNOSIS — M48062 Spinal stenosis, lumbar region with neurogenic claudication: Secondary | ICD-10-CM | POA: Diagnosis not present

## 2017-09-16 DIAGNOSIS — J439 Emphysema, unspecified: Secondary | ICD-10-CM | POA: Diagnosis not present

## 2017-09-16 DIAGNOSIS — I110 Hypertensive heart disease with heart failure: Secondary | ICD-10-CM | POA: Diagnosis not present

## 2017-09-16 DIAGNOSIS — Z4789 Encounter for other orthopedic aftercare: Secondary | ICD-10-CM | POA: Diagnosis not present

## 2017-09-16 DIAGNOSIS — I509 Heart failure, unspecified: Secondary | ICD-10-CM | POA: Diagnosis not present

## 2017-09-17 DIAGNOSIS — Z4789 Encounter for other orthopedic aftercare: Secondary | ICD-10-CM | POA: Diagnosis not present

## 2017-09-17 DIAGNOSIS — I110 Hypertensive heart disease with heart failure: Secondary | ICD-10-CM | POA: Diagnosis not present

## 2017-09-17 DIAGNOSIS — M47896 Other spondylosis, lumbar region: Secondary | ICD-10-CM | POA: Diagnosis not present

## 2017-09-17 DIAGNOSIS — J439 Emphysema, unspecified: Secondary | ICD-10-CM | POA: Diagnosis not present

## 2017-09-17 DIAGNOSIS — M48062 Spinal stenosis, lumbar region with neurogenic claudication: Secondary | ICD-10-CM | POA: Diagnosis not present

## 2017-09-17 DIAGNOSIS — I509 Heart failure, unspecified: Secondary | ICD-10-CM | POA: Diagnosis not present

## 2017-09-19 DIAGNOSIS — M48062 Spinal stenosis, lumbar region with neurogenic claudication: Secondary | ICD-10-CM | POA: Diagnosis not present

## 2017-09-19 DIAGNOSIS — J439 Emphysema, unspecified: Secondary | ICD-10-CM | POA: Diagnosis not present

## 2017-09-19 DIAGNOSIS — I110 Hypertensive heart disease with heart failure: Secondary | ICD-10-CM | POA: Diagnosis not present

## 2017-09-19 DIAGNOSIS — M47896 Other spondylosis, lumbar region: Secondary | ICD-10-CM | POA: Diagnosis not present

## 2017-09-19 DIAGNOSIS — I509 Heart failure, unspecified: Secondary | ICD-10-CM | POA: Diagnosis not present

## 2017-09-19 DIAGNOSIS — Z4789 Encounter for other orthopedic aftercare: Secondary | ICD-10-CM | POA: Diagnosis not present

## 2017-09-20 DIAGNOSIS — M48062 Spinal stenosis, lumbar region with neurogenic claudication: Secondary | ICD-10-CM | POA: Diagnosis not present

## 2017-09-20 DIAGNOSIS — J439 Emphysema, unspecified: Secondary | ICD-10-CM | POA: Diagnosis not present

## 2017-09-20 DIAGNOSIS — M47896 Other spondylosis, lumbar region: Secondary | ICD-10-CM | POA: Diagnosis not present

## 2017-09-20 DIAGNOSIS — Z4789 Encounter for other orthopedic aftercare: Secondary | ICD-10-CM | POA: Diagnosis not present

## 2017-09-20 DIAGNOSIS — I509 Heart failure, unspecified: Secondary | ICD-10-CM | POA: Diagnosis not present

## 2017-09-20 DIAGNOSIS — I110 Hypertensive heart disease with heart failure: Secondary | ICD-10-CM | POA: Diagnosis not present

## 2017-09-24 DIAGNOSIS — M48062 Spinal stenosis, lumbar region with neurogenic claudication: Secondary | ICD-10-CM | POA: Diagnosis not present

## 2017-09-24 DIAGNOSIS — Z4789 Encounter for other orthopedic aftercare: Secondary | ICD-10-CM | POA: Diagnosis not present

## 2017-09-24 DIAGNOSIS — J439 Emphysema, unspecified: Secondary | ICD-10-CM | POA: Diagnosis not present

## 2017-09-24 DIAGNOSIS — M47896 Other spondylosis, lumbar region: Secondary | ICD-10-CM | POA: Diagnosis not present

## 2017-09-24 DIAGNOSIS — I110 Hypertensive heart disease with heart failure: Secondary | ICD-10-CM | POA: Diagnosis not present

## 2017-09-24 DIAGNOSIS — I509 Heart failure, unspecified: Secondary | ICD-10-CM | POA: Diagnosis not present

## 2017-09-25 DIAGNOSIS — I509 Heart failure, unspecified: Secondary | ICD-10-CM | POA: Diagnosis not present

## 2017-09-25 DIAGNOSIS — M47896 Other spondylosis, lumbar region: Secondary | ICD-10-CM | POA: Diagnosis not present

## 2017-09-25 DIAGNOSIS — M48062 Spinal stenosis, lumbar region with neurogenic claudication: Secondary | ICD-10-CM | POA: Diagnosis not present

## 2017-09-25 DIAGNOSIS — Z4789 Encounter for other orthopedic aftercare: Secondary | ICD-10-CM | POA: Diagnosis not present

## 2017-09-25 DIAGNOSIS — J439 Emphysema, unspecified: Secondary | ICD-10-CM | POA: Diagnosis not present

## 2017-09-25 DIAGNOSIS — I110 Hypertensive heart disease with heart failure: Secondary | ICD-10-CM | POA: Diagnosis not present

## 2017-09-26 DIAGNOSIS — I509 Heart failure, unspecified: Secondary | ICD-10-CM | POA: Diagnosis not present

## 2017-09-26 DIAGNOSIS — I110 Hypertensive heart disease with heart failure: Secondary | ICD-10-CM | POA: Diagnosis not present

## 2017-09-26 DIAGNOSIS — J439 Emphysema, unspecified: Secondary | ICD-10-CM | POA: Diagnosis not present

## 2017-09-26 DIAGNOSIS — M48062 Spinal stenosis, lumbar region with neurogenic claudication: Secondary | ICD-10-CM | POA: Diagnosis not present

## 2017-09-26 DIAGNOSIS — M47896 Other spondylosis, lumbar region: Secondary | ICD-10-CM | POA: Diagnosis not present

## 2017-09-26 DIAGNOSIS — Z4789 Encounter for other orthopedic aftercare: Secondary | ICD-10-CM | POA: Diagnosis not present

## 2017-09-30 DIAGNOSIS — R51 Headache: Secondary | ICD-10-CM | POA: Diagnosis not present

## 2017-10-01 DIAGNOSIS — I110 Hypertensive heart disease with heart failure: Secondary | ICD-10-CM | POA: Diagnosis not present

## 2017-10-01 DIAGNOSIS — I509 Heart failure, unspecified: Secondary | ICD-10-CM | POA: Diagnosis not present

## 2017-10-01 DIAGNOSIS — M48062 Spinal stenosis, lumbar region with neurogenic claudication: Secondary | ICD-10-CM | POA: Diagnosis not present

## 2017-10-01 DIAGNOSIS — J439 Emphysema, unspecified: Secondary | ICD-10-CM | POA: Diagnosis not present

## 2017-10-01 DIAGNOSIS — Z4789 Encounter for other orthopedic aftercare: Secondary | ICD-10-CM | POA: Diagnosis not present

## 2017-10-01 DIAGNOSIS — M47896 Other spondylosis, lumbar region: Secondary | ICD-10-CM | POA: Diagnosis not present

## 2017-10-02 DIAGNOSIS — I509 Heart failure, unspecified: Secondary | ICD-10-CM | POA: Diagnosis not present

## 2017-10-02 DIAGNOSIS — M48062 Spinal stenosis, lumbar region with neurogenic claudication: Secondary | ICD-10-CM | POA: Diagnosis not present

## 2017-10-02 DIAGNOSIS — J439 Emphysema, unspecified: Secondary | ICD-10-CM | POA: Diagnosis not present

## 2017-10-02 DIAGNOSIS — Z4789 Encounter for other orthopedic aftercare: Secondary | ICD-10-CM | POA: Diagnosis not present

## 2017-10-02 DIAGNOSIS — M47896 Other spondylosis, lumbar region: Secondary | ICD-10-CM | POA: Diagnosis not present

## 2017-10-02 DIAGNOSIS — I110 Hypertensive heart disease with heart failure: Secondary | ICD-10-CM | POA: Diagnosis not present

## 2017-10-03 DIAGNOSIS — F419 Anxiety disorder, unspecified: Secondary | ICD-10-CM | POA: Diagnosis not present

## 2017-10-03 DIAGNOSIS — G44229 Chronic tension-type headache, not intractable: Secondary | ICD-10-CM | POA: Diagnosis not present

## 2017-10-03 DIAGNOSIS — M47896 Other spondylosis, lumbar region: Secondary | ICD-10-CM | POA: Diagnosis not present

## 2017-10-03 DIAGNOSIS — F0281 Dementia in other diseases classified elsewhere with behavioral disturbance: Secondary | ICD-10-CM | POA: Diagnosis not present

## 2017-10-03 DIAGNOSIS — J439 Emphysema, unspecified: Secondary | ICD-10-CM | POA: Diagnosis not present

## 2017-10-03 DIAGNOSIS — I110 Hypertensive heart disease with heart failure: Secondary | ICD-10-CM | POA: Diagnosis not present

## 2017-10-03 DIAGNOSIS — G301 Alzheimer's disease with late onset: Secondary | ICD-10-CM | POA: Diagnosis not present

## 2017-10-03 DIAGNOSIS — I509 Heart failure, unspecified: Secondary | ICD-10-CM | POA: Diagnosis not present

## 2017-10-03 DIAGNOSIS — Z0000 Encounter for general adult medical examination without abnormal findings: Secondary | ICD-10-CM | POA: Diagnosis not present

## 2017-10-03 DIAGNOSIS — Z4789 Encounter for other orthopedic aftercare: Secondary | ICD-10-CM | POA: Diagnosis not present

## 2017-10-03 DIAGNOSIS — M48062 Spinal stenosis, lumbar region with neurogenic claudication: Secondary | ICD-10-CM | POA: Diagnosis not present

## 2017-10-07 DIAGNOSIS — M47896 Other spondylosis, lumbar region: Secondary | ICD-10-CM | POA: Diagnosis not present

## 2017-10-07 DIAGNOSIS — Z4789 Encounter for other orthopedic aftercare: Secondary | ICD-10-CM | POA: Diagnosis not present

## 2017-10-07 DIAGNOSIS — M48062 Spinal stenosis, lumbar region with neurogenic claudication: Secondary | ICD-10-CM | POA: Diagnosis not present

## 2017-10-07 DIAGNOSIS — I110 Hypertensive heart disease with heart failure: Secondary | ICD-10-CM | POA: Diagnosis not present

## 2017-10-07 DIAGNOSIS — I509 Heart failure, unspecified: Secondary | ICD-10-CM | POA: Diagnosis not present

## 2017-10-07 DIAGNOSIS — J439 Emphysema, unspecified: Secondary | ICD-10-CM | POA: Diagnosis not present

## 2017-10-08 DIAGNOSIS — J439 Emphysema, unspecified: Secondary | ICD-10-CM | POA: Diagnosis not present

## 2017-10-08 DIAGNOSIS — Z4789 Encounter for other orthopedic aftercare: Secondary | ICD-10-CM | POA: Diagnosis not present

## 2017-10-08 DIAGNOSIS — M48062 Spinal stenosis, lumbar region with neurogenic claudication: Secondary | ICD-10-CM | POA: Diagnosis not present

## 2017-10-08 DIAGNOSIS — I509 Heart failure, unspecified: Secondary | ICD-10-CM | POA: Diagnosis not present

## 2017-10-08 DIAGNOSIS — I110 Hypertensive heart disease with heart failure: Secondary | ICD-10-CM | POA: Diagnosis not present

## 2017-10-08 DIAGNOSIS — M47896 Other spondylosis, lumbar region: Secondary | ICD-10-CM | POA: Diagnosis not present

## 2017-10-14 DIAGNOSIS — M48062 Spinal stenosis, lumbar region with neurogenic claudication: Secondary | ICD-10-CM | POA: Diagnosis not present

## 2017-10-14 DIAGNOSIS — I509 Heart failure, unspecified: Secondary | ICD-10-CM | POA: Diagnosis not present

## 2017-10-14 DIAGNOSIS — Z4789 Encounter for other orthopedic aftercare: Secondary | ICD-10-CM | POA: Diagnosis not present

## 2017-10-14 DIAGNOSIS — J439 Emphysema, unspecified: Secondary | ICD-10-CM | POA: Diagnosis not present

## 2017-10-14 DIAGNOSIS — I110 Hypertensive heart disease with heart failure: Secondary | ICD-10-CM | POA: Diagnosis not present

## 2017-10-14 DIAGNOSIS — M47896 Other spondylosis, lumbar region: Secondary | ICD-10-CM | POA: Diagnosis not present

## 2017-10-17 DIAGNOSIS — I110 Hypertensive heart disease with heart failure: Secondary | ICD-10-CM | POA: Diagnosis not present

## 2017-10-17 DIAGNOSIS — J439 Emphysema, unspecified: Secondary | ICD-10-CM | POA: Diagnosis not present

## 2017-10-17 DIAGNOSIS — I509 Heart failure, unspecified: Secondary | ICD-10-CM | POA: Diagnosis not present

## 2017-10-17 DIAGNOSIS — Z4789 Encounter for other orthopedic aftercare: Secondary | ICD-10-CM | POA: Diagnosis not present

## 2017-10-17 DIAGNOSIS — M47896 Other spondylosis, lumbar region: Secondary | ICD-10-CM | POA: Diagnosis not present

## 2017-10-17 DIAGNOSIS — M48062 Spinal stenosis, lumbar region with neurogenic claudication: Secondary | ICD-10-CM | POA: Diagnosis not present

## 2017-10-23 DIAGNOSIS — J439 Emphysema, unspecified: Secondary | ICD-10-CM | POA: Diagnosis not present

## 2017-10-23 DIAGNOSIS — M47896 Other spondylosis, lumbar region: Secondary | ICD-10-CM | POA: Diagnosis not present

## 2017-10-23 DIAGNOSIS — F419 Anxiety disorder, unspecified: Secondary | ICD-10-CM | POA: Diagnosis not present

## 2017-10-23 DIAGNOSIS — G300 Alzheimer's disease with early onset: Secondary | ICD-10-CM | POA: Diagnosis not present

## 2017-10-23 DIAGNOSIS — Z79899 Other long term (current) drug therapy: Secondary | ICD-10-CM | POA: Diagnosis not present

## 2017-10-23 DIAGNOSIS — I110 Hypertensive heart disease with heart failure: Secondary | ICD-10-CM | POA: Diagnosis not present

## 2017-10-23 DIAGNOSIS — M48062 Spinal stenosis, lumbar region with neurogenic claudication: Secondary | ICD-10-CM | POA: Diagnosis not present

## 2017-10-23 DIAGNOSIS — Z981 Arthrodesis status: Secondary | ICD-10-CM | POA: Diagnosis not present

## 2017-10-23 DIAGNOSIS — F0280 Dementia in other diseases classified elsewhere without behavioral disturbance: Secondary | ICD-10-CM | POA: Diagnosis not present

## 2017-10-23 DIAGNOSIS — E785 Hyperlipidemia, unspecified: Secondary | ICD-10-CM | POA: Diagnosis not present

## 2017-10-23 DIAGNOSIS — I442 Atrioventricular block, complete: Secondary | ICD-10-CM | POA: Diagnosis not present

## 2017-10-23 DIAGNOSIS — N400 Enlarged prostate without lower urinary tract symptoms: Secondary | ICD-10-CM | POA: Diagnosis not present

## 2017-10-23 DIAGNOSIS — I509 Heart failure, unspecified: Secondary | ICD-10-CM | POA: Diagnosis not present

## 2017-10-23 DIAGNOSIS — G4733 Obstructive sleep apnea (adult) (pediatric): Secondary | ICD-10-CM | POA: Diagnosis not present

## 2017-10-24 DIAGNOSIS — I110 Hypertensive heart disease with heart failure: Secondary | ICD-10-CM | POA: Diagnosis not present

## 2017-10-24 DIAGNOSIS — G300 Alzheimer's disease with early onset: Secondary | ICD-10-CM | POA: Diagnosis not present

## 2017-10-24 DIAGNOSIS — M48062 Spinal stenosis, lumbar region with neurogenic claudication: Secondary | ICD-10-CM | POA: Diagnosis not present

## 2017-10-24 DIAGNOSIS — I509 Heart failure, unspecified: Secondary | ICD-10-CM | POA: Diagnosis not present

## 2017-10-24 DIAGNOSIS — M47896 Other spondylosis, lumbar region: Secondary | ICD-10-CM | POA: Diagnosis not present

## 2017-10-24 DIAGNOSIS — F0280 Dementia in other diseases classified elsewhere without behavioral disturbance: Secondary | ICD-10-CM | POA: Diagnosis not present

## 2017-10-30 DIAGNOSIS — I509 Heart failure, unspecified: Secondary | ICD-10-CM | POA: Diagnosis not present

## 2017-10-30 DIAGNOSIS — I110 Hypertensive heart disease with heart failure: Secondary | ICD-10-CM | POA: Diagnosis not present

## 2017-10-30 DIAGNOSIS — F0280 Dementia in other diseases classified elsewhere without behavioral disturbance: Secondary | ICD-10-CM | POA: Diagnosis not present

## 2017-10-30 DIAGNOSIS — G300 Alzheimer's disease with early onset: Secondary | ICD-10-CM | POA: Diagnosis not present

## 2017-10-30 DIAGNOSIS — M47896 Other spondylosis, lumbar region: Secondary | ICD-10-CM | POA: Diagnosis not present

## 2017-10-30 DIAGNOSIS — M48062 Spinal stenosis, lumbar region with neurogenic claudication: Secondary | ICD-10-CM | POA: Diagnosis not present

## 2017-11-01 DIAGNOSIS — G300 Alzheimer's disease with early onset: Secondary | ICD-10-CM | POA: Diagnosis not present

## 2017-11-01 DIAGNOSIS — I110 Hypertensive heart disease with heart failure: Secondary | ICD-10-CM | POA: Diagnosis not present

## 2017-11-01 DIAGNOSIS — I509 Heart failure, unspecified: Secondary | ICD-10-CM | POA: Diagnosis not present

## 2017-11-01 DIAGNOSIS — M48062 Spinal stenosis, lumbar region with neurogenic claudication: Secondary | ICD-10-CM | POA: Diagnosis not present

## 2017-11-01 DIAGNOSIS — M47896 Other spondylosis, lumbar region: Secondary | ICD-10-CM | POA: Diagnosis not present

## 2017-11-01 DIAGNOSIS — F0280 Dementia in other diseases classified elsewhere without behavioral disturbance: Secondary | ICD-10-CM | POA: Diagnosis not present

## 2017-11-05 DIAGNOSIS — I110 Hypertensive heart disease with heart failure: Secondary | ICD-10-CM | POA: Diagnosis not present

## 2017-11-05 DIAGNOSIS — F0280 Dementia in other diseases classified elsewhere without behavioral disturbance: Secondary | ICD-10-CM | POA: Diagnosis not present

## 2017-11-05 DIAGNOSIS — M48062 Spinal stenosis, lumbar region with neurogenic claudication: Secondary | ICD-10-CM | POA: Diagnosis not present

## 2017-11-05 DIAGNOSIS — M47896 Other spondylosis, lumbar region: Secondary | ICD-10-CM | POA: Diagnosis not present

## 2017-11-05 DIAGNOSIS — I509 Heart failure, unspecified: Secondary | ICD-10-CM | POA: Diagnosis not present

## 2017-11-05 DIAGNOSIS — G300 Alzheimer's disease with early onset: Secondary | ICD-10-CM | POA: Diagnosis not present

## 2017-11-07 DIAGNOSIS — I110 Hypertensive heart disease with heart failure: Secondary | ICD-10-CM | POA: Diagnosis not present

## 2017-11-07 DIAGNOSIS — F0280 Dementia in other diseases classified elsewhere without behavioral disturbance: Secondary | ICD-10-CM | POA: Diagnosis not present

## 2017-11-07 DIAGNOSIS — G300 Alzheimer's disease with early onset: Secondary | ICD-10-CM | POA: Diagnosis not present

## 2017-11-07 DIAGNOSIS — M47896 Other spondylosis, lumbar region: Secondary | ICD-10-CM | POA: Diagnosis not present

## 2017-11-07 DIAGNOSIS — M48062 Spinal stenosis, lumbar region with neurogenic claudication: Secondary | ICD-10-CM | POA: Diagnosis not present

## 2017-11-07 DIAGNOSIS — I509 Heart failure, unspecified: Secondary | ICD-10-CM | POA: Diagnosis not present

## 2017-11-12 DIAGNOSIS — I509 Heart failure, unspecified: Secondary | ICD-10-CM | POA: Diagnosis not present

## 2017-11-12 DIAGNOSIS — M47896 Other spondylosis, lumbar region: Secondary | ICD-10-CM | POA: Diagnosis not present

## 2017-11-12 DIAGNOSIS — M48062 Spinal stenosis, lumbar region with neurogenic claudication: Secondary | ICD-10-CM | POA: Diagnosis not present

## 2017-11-12 DIAGNOSIS — I110 Hypertensive heart disease with heart failure: Secondary | ICD-10-CM | POA: Diagnosis not present

## 2017-11-12 DIAGNOSIS — F0280 Dementia in other diseases classified elsewhere without behavioral disturbance: Secondary | ICD-10-CM | POA: Diagnosis not present

## 2017-11-12 DIAGNOSIS — G300 Alzheimer's disease with early onset: Secondary | ICD-10-CM | POA: Diagnosis not present

## 2017-11-14 DIAGNOSIS — M47896 Other spondylosis, lumbar region: Secondary | ICD-10-CM | POA: Diagnosis not present

## 2017-11-14 DIAGNOSIS — F0280 Dementia in other diseases classified elsewhere without behavioral disturbance: Secondary | ICD-10-CM | POA: Diagnosis not present

## 2017-11-14 DIAGNOSIS — I509 Heart failure, unspecified: Secondary | ICD-10-CM | POA: Diagnosis not present

## 2017-11-14 DIAGNOSIS — G300 Alzheimer's disease with early onset: Secondary | ICD-10-CM | POA: Diagnosis not present

## 2017-11-14 DIAGNOSIS — I110 Hypertensive heart disease with heart failure: Secondary | ICD-10-CM | POA: Diagnosis not present

## 2017-11-14 DIAGNOSIS — M48062 Spinal stenosis, lumbar region with neurogenic claudication: Secondary | ICD-10-CM | POA: Diagnosis not present

## 2017-11-18 DIAGNOSIS — F0390 Unspecified dementia without behavioral disturbance: Secondary | ICD-10-CM | POA: Diagnosis not present

## 2017-11-18 DIAGNOSIS — F0391 Unspecified dementia with behavioral disturbance: Secondary | ICD-10-CM | POA: Diagnosis not present

## 2017-11-20 DIAGNOSIS — I110 Hypertensive heart disease with heart failure: Secondary | ICD-10-CM | POA: Diagnosis not present

## 2017-11-20 DIAGNOSIS — F0280 Dementia in other diseases classified elsewhere without behavioral disturbance: Secondary | ICD-10-CM | POA: Diagnosis not present

## 2017-11-20 DIAGNOSIS — I509 Heart failure, unspecified: Secondary | ICD-10-CM | POA: Diagnosis not present

## 2017-11-20 DIAGNOSIS — M47896 Other spondylosis, lumbar region: Secondary | ICD-10-CM | POA: Diagnosis not present

## 2017-11-20 DIAGNOSIS — M48062 Spinal stenosis, lumbar region with neurogenic claudication: Secondary | ICD-10-CM | POA: Diagnosis not present

## 2017-11-20 DIAGNOSIS — G300 Alzheimer's disease with early onset: Secondary | ICD-10-CM | POA: Diagnosis not present

## 2017-11-21 DIAGNOSIS — S299XXA Unspecified injury of thorax, initial encounter: Secondary | ICD-10-CM | POA: Diagnosis not present

## 2017-11-21 DIAGNOSIS — I110 Hypertensive heart disease with heart failure: Secondary | ICD-10-CM | POA: Diagnosis not present

## 2017-11-21 DIAGNOSIS — T68XXXA Hypothermia, initial encounter: Secondary | ICD-10-CM | POA: Diagnosis not present

## 2017-11-21 DIAGNOSIS — J449 Chronic obstructive pulmonary disease, unspecified: Secondary | ICD-10-CM | POA: Diagnosis not present

## 2017-11-21 DIAGNOSIS — Z79899 Other long term (current) drug therapy: Secondary | ICD-10-CM | POA: Diagnosis not present

## 2017-11-21 DIAGNOSIS — R680 Hypothermia, not associated with low environmental temperature: Secondary | ICD-10-CM | POA: Diagnosis not present

## 2017-11-21 DIAGNOSIS — M25512 Pain in left shoulder: Secondary | ICD-10-CM | POA: Diagnosis not present

## 2017-11-21 DIAGNOSIS — I4891 Unspecified atrial fibrillation: Secondary | ICD-10-CM | POA: Diagnosis not present

## 2017-11-21 DIAGNOSIS — R55 Syncope and collapse: Secondary | ICD-10-CM | POA: Diagnosis not present

## 2017-11-21 DIAGNOSIS — F0390 Unspecified dementia without behavioral disturbance: Secondary | ICD-10-CM | POA: Diagnosis not present

## 2017-11-21 DIAGNOSIS — R410 Disorientation, unspecified: Secondary | ICD-10-CM | POA: Diagnosis not present

## 2017-11-21 DIAGNOSIS — I509 Heart failure, unspecified: Secondary | ICD-10-CM | POA: Diagnosis not present

## 2017-11-23 DIAGNOSIS — Z23 Encounter for immunization: Secondary | ICD-10-CM | POA: Diagnosis not present

## 2017-11-28 DIAGNOSIS — F0280 Dementia in other diseases classified elsewhere without behavioral disturbance: Secondary | ICD-10-CM | POA: Diagnosis not present

## 2017-11-28 DIAGNOSIS — M48062 Spinal stenosis, lumbar region with neurogenic claudication: Secondary | ICD-10-CM | POA: Diagnosis not present

## 2017-11-28 DIAGNOSIS — G300 Alzheimer's disease with early onset: Secondary | ICD-10-CM | POA: Diagnosis not present

## 2017-11-28 DIAGNOSIS — I110 Hypertensive heart disease with heart failure: Secondary | ICD-10-CM | POA: Diagnosis not present

## 2017-11-28 DIAGNOSIS — M47896 Other spondylosis, lumbar region: Secondary | ICD-10-CM | POA: Diagnosis not present

## 2017-11-28 DIAGNOSIS — I509 Heart failure, unspecified: Secondary | ICD-10-CM | POA: Diagnosis not present

## 2017-12-05 DIAGNOSIS — M47896 Other spondylosis, lumbar region: Secondary | ICD-10-CM | POA: Diagnosis not present

## 2017-12-05 DIAGNOSIS — M48062 Spinal stenosis, lumbar region with neurogenic claudication: Secondary | ICD-10-CM | POA: Diagnosis not present

## 2017-12-05 DIAGNOSIS — I509 Heart failure, unspecified: Secondary | ICD-10-CM | POA: Diagnosis not present

## 2017-12-05 DIAGNOSIS — F0280 Dementia in other diseases classified elsewhere without behavioral disturbance: Secondary | ICD-10-CM | POA: Diagnosis not present

## 2017-12-05 DIAGNOSIS — G300 Alzheimer's disease with early onset: Secondary | ICD-10-CM | POA: Diagnosis not present

## 2017-12-05 DIAGNOSIS — I110 Hypertensive heart disease with heart failure: Secondary | ICD-10-CM | POA: Diagnosis not present

## 2017-12-06 DIAGNOSIS — F0280 Dementia in other diseases classified elsewhere without behavioral disturbance: Secondary | ICD-10-CM | POA: Diagnosis not present

## 2017-12-06 DIAGNOSIS — I110 Hypertensive heart disease with heart failure: Secondary | ICD-10-CM | POA: Diagnosis not present

## 2017-12-06 DIAGNOSIS — M47896 Other spondylosis, lumbar region: Secondary | ICD-10-CM | POA: Diagnosis not present

## 2017-12-06 DIAGNOSIS — I509 Heart failure, unspecified: Secondary | ICD-10-CM | POA: Diagnosis not present

## 2017-12-06 DIAGNOSIS — G300 Alzheimer's disease with early onset: Secondary | ICD-10-CM | POA: Diagnosis not present

## 2017-12-06 DIAGNOSIS — M48062 Spinal stenosis, lumbar region with neurogenic claudication: Secondary | ICD-10-CM | POA: Diagnosis not present

## 2017-12-09 DIAGNOSIS — M47896 Other spondylosis, lumbar region: Secondary | ICD-10-CM | POA: Diagnosis not present

## 2017-12-09 DIAGNOSIS — I110 Hypertensive heart disease with heart failure: Secondary | ICD-10-CM | POA: Diagnosis not present

## 2017-12-09 DIAGNOSIS — F0280 Dementia in other diseases classified elsewhere without behavioral disturbance: Secondary | ICD-10-CM | POA: Diagnosis not present

## 2017-12-09 DIAGNOSIS — M48062 Spinal stenosis, lumbar region with neurogenic claudication: Secondary | ICD-10-CM | POA: Diagnosis not present

## 2017-12-09 DIAGNOSIS — I509 Heart failure, unspecified: Secondary | ICD-10-CM | POA: Diagnosis not present

## 2017-12-09 DIAGNOSIS — G300 Alzheimer's disease with early onset: Secondary | ICD-10-CM | POA: Diagnosis not present

## 2017-12-10 DIAGNOSIS — I509 Heart failure, unspecified: Secondary | ICD-10-CM | POA: Diagnosis not present

## 2017-12-10 DIAGNOSIS — F0280 Dementia in other diseases classified elsewhere without behavioral disturbance: Secondary | ICD-10-CM | POA: Diagnosis not present

## 2017-12-10 DIAGNOSIS — M48062 Spinal stenosis, lumbar region with neurogenic claudication: Secondary | ICD-10-CM | POA: Diagnosis not present

## 2017-12-10 DIAGNOSIS — M47896 Other spondylosis, lumbar region: Secondary | ICD-10-CM | POA: Diagnosis not present

## 2017-12-10 DIAGNOSIS — G300 Alzheimer's disease with early onset: Secondary | ICD-10-CM | POA: Diagnosis not present

## 2017-12-10 DIAGNOSIS — I110 Hypertensive heart disease with heart failure: Secondary | ICD-10-CM | POA: Diagnosis not present

## 2017-12-11 DIAGNOSIS — G300 Alzheimer's disease with early onset: Secondary | ICD-10-CM | POA: Diagnosis not present

## 2017-12-11 DIAGNOSIS — I509 Heart failure, unspecified: Secondary | ICD-10-CM | POA: Diagnosis not present

## 2017-12-11 DIAGNOSIS — I110 Hypertensive heart disease with heart failure: Secondary | ICD-10-CM | POA: Diagnosis not present

## 2017-12-11 DIAGNOSIS — M48062 Spinal stenosis, lumbar region with neurogenic claudication: Secondary | ICD-10-CM | POA: Diagnosis not present

## 2017-12-11 DIAGNOSIS — M47896 Other spondylosis, lumbar region: Secondary | ICD-10-CM | POA: Diagnosis not present

## 2017-12-11 DIAGNOSIS — F0280 Dementia in other diseases classified elsewhere without behavioral disturbance: Secondary | ICD-10-CM | POA: Diagnosis not present

## 2017-12-12 DIAGNOSIS — G300 Alzheimer's disease with early onset: Secondary | ICD-10-CM | POA: Diagnosis not present

## 2017-12-12 DIAGNOSIS — I509 Heart failure, unspecified: Secondary | ICD-10-CM | POA: Diagnosis not present

## 2017-12-12 DIAGNOSIS — M48062 Spinal stenosis, lumbar region with neurogenic claudication: Secondary | ICD-10-CM | POA: Diagnosis not present

## 2017-12-12 DIAGNOSIS — I110 Hypertensive heart disease with heart failure: Secondary | ICD-10-CM | POA: Diagnosis not present

## 2017-12-12 DIAGNOSIS — M47896 Other spondylosis, lumbar region: Secondary | ICD-10-CM | POA: Diagnosis not present

## 2017-12-12 DIAGNOSIS — F0280 Dementia in other diseases classified elsewhere without behavioral disturbance: Secondary | ICD-10-CM | POA: Diagnosis not present

## 2017-12-13 DIAGNOSIS — G301 Alzheimer's disease with late onset: Secondary | ICD-10-CM | POA: Diagnosis not present

## 2017-12-13 DIAGNOSIS — R399 Unspecified symptoms and signs involving the genitourinary system: Secondary | ICD-10-CM | POA: Diagnosis not present

## 2017-12-13 DIAGNOSIS — J441 Chronic obstructive pulmonary disease with (acute) exacerbation: Secondary | ICD-10-CM | POA: Diagnosis not present

## 2017-12-13 DIAGNOSIS — F0281 Dementia in other diseases classified elsewhere with behavioral disturbance: Secondary | ICD-10-CM | POA: Diagnosis not present

## 2017-12-16 DIAGNOSIS — I110 Hypertensive heart disease with heart failure: Secondary | ICD-10-CM | POA: Diagnosis not present

## 2017-12-16 DIAGNOSIS — I509 Heart failure, unspecified: Secondary | ICD-10-CM | POA: Diagnosis not present

## 2017-12-16 DIAGNOSIS — F0280 Dementia in other diseases classified elsewhere without behavioral disturbance: Secondary | ICD-10-CM | POA: Diagnosis not present

## 2017-12-16 DIAGNOSIS — M48062 Spinal stenosis, lumbar region with neurogenic claudication: Secondary | ICD-10-CM | POA: Diagnosis not present

## 2017-12-16 DIAGNOSIS — M47896 Other spondylosis, lumbar region: Secondary | ICD-10-CM | POA: Diagnosis not present

## 2017-12-16 DIAGNOSIS — G300 Alzheimer's disease with early onset: Secondary | ICD-10-CM | POA: Diagnosis not present

## 2017-12-18 DIAGNOSIS — I509 Heart failure, unspecified: Secondary | ICD-10-CM | POA: Diagnosis not present

## 2017-12-18 DIAGNOSIS — M47896 Other spondylosis, lumbar region: Secondary | ICD-10-CM | POA: Diagnosis not present

## 2017-12-18 DIAGNOSIS — I110 Hypertensive heart disease with heart failure: Secondary | ICD-10-CM | POA: Diagnosis not present

## 2017-12-18 DIAGNOSIS — G300 Alzheimer's disease with early onset: Secondary | ICD-10-CM | POA: Diagnosis not present

## 2017-12-18 DIAGNOSIS — M48062 Spinal stenosis, lumbar region with neurogenic claudication: Secondary | ICD-10-CM | POA: Diagnosis not present

## 2017-12-18 DIAGNOSIS — F0280 Dementia in other diseases classified elsewhere without behavioral disturbance: Secondary | ICD-10-CM | POA: Diagnosis not present

## 2017-12-19 DIAGNOSIS — I110 Hypertensive heart disease with heart failure: Secondary | ICD-10-CM | POA: Diagnosis not present

## 2017-12-19 DIAGNOSIS — M47896 Other spondylosis, lumbar region: Secondary | ICD-10-CM | POA: Diagnosis not present

## 2017-12-19 DIAGNOSIS — M48062 Spinal stenosis, lumbar region with neurogenic claudication: Secondary | ICD-10-CM | POA: Diagnosis not present

## 2017-12-19 DIAGNOSIS — F0280 Dementia in other diseases classified elsewhere without behavioral disturbance: Secondary | ICD-10-CM | POA: Diagnosis not present

## 2017-12-19 DIAGNOSIS — I509 Heart failure, unspecified: Secondary | ICD-10-CM | POA: Diagnosis not present

## 2017-12-19 DIAGNOSIS — G300 Alzheimer's disease with early onset: Secondary | ICD-10-CM | POA: Diagnosis not present

## 2017-12-22 DIAGNOSIS — M47896 Other spondylosis, lumbar region: Secondary | ICD-10-CM | POA: Diagnosis not present

## 2017-12-22 DIAGNOSIS — E785 Hyperlipidemia, unspecified: Secondary | ICD-10-CM | POA: Diagnosis not present

## 2017-12-22 DIAGNOSIS — N390 Urinary tract infection, site not specified: Secondary | ICD-10-CM | POA: Diagnosis not present

## 2017-12-22 DIAGNOSIS — S51802D Unspecified open wound of left forearm, subsequent encounter: Secondary | ICD-10-CM | POA: Diagnosis not present

## 2017-12-22 DIAGNOSIS — I509 Heart failure, unspecified: Secondary | ICD-10-CM | POA: Diagnosis not present

## 2017-12-22 DIAGNOSIS — N400 Enlarged prostate without lower urinary tract symptoms: Secondary | ICD-10-CM | POA: Diagnosis not present

## 2017-12-22 DIAGNOSIS — F419 Anxiety disorder, unspecified: Secondary | ICD-10-CM | POA: Diagnosis not present

## 2017-12-22 DIAGNOSIS — J439 Emphysema, unspecified: Secondary | ICD-10-CM | POA: Diagnosis not present

## 2017-12-22 DIAGNOSIS — G4733 Obstructive sleep apnea (adult) (pediatric): Secondary | ICD-10-CM | POA: Diagnosis not present

## 2017-12-22 DIAGNOSIS — I110 Hypertensive heart disease with heart failure: Secondary | ICD-10-CM | POA: Diagnosis not present

## 2017-12-22 DIAGNOSIS — Z981 Arthrodesis status: Secondary | ICD-10-CM | POA: Diagnosis not present

## 2017-12-22 DIAGNOSIS — S82002D Unspecified fracture of left patella, subsequent encounter for closed fracture with routine healing: Secondary | ICD-10-CM | POA: Diagnosis not present

## 2017-12-22 DIAGNOSIS — F0280 Dementia in other diseases classified elsewhere without behavioral disturbance: Secondary | ICD-10-CM | POA: Diagnosis not present

## 2017-12-22 DIAGNOSIS — I442 Atrioventricular block, complete: Secondary | ICD-10-CM | POA: Diagnosis not present

## 2017-12-22 DIAGNOSIS — G300 Alzheimer's disease with early onset: Secondary | ICD-10-CM | POA: Diagnosis not present

## 2017-12-22 DIAGNOSIS — M48062 Spinal stenosis, lumbar region with neurogenic claudication: Secondary | ICD-10-CM | POA: Diagnosis not present

## 2017-12-22 DIAGNOSIS — Z79899 Other long term (current) drug therapy: Secondary | ICD-10-CM | POA: Diagnosis not present

## 2017-12-23 DIAGNOSIS — G300 Alzheimer's disease with early onset: Secondary | ICD-10-CM | POA: Diagnosis not present

## 2017-12-23 DIAGNOSIS — S51802D Unspecified open wound of left forearm, subsequent encounter: Secondary | ICD-10-CM | POA: Diagnosis not present

## 2017-12-23 DIAGNOSIS — M48062 Spinal stenosis, lumbar region with neurogenic claudication: Secondary | ICD-10-CM | POA: Diagnosis not present

## 2017-12-23 DIAGNOSIS — N390 Urinary tract infection, site not specified: Secondary | ICD-10-CM | POA: Diagnosis not present

## 2017-12-23 DIAGNOSIS — F0280 Dementia in other diseases classified elsewhere without behavioral disturbance: Secondary | ICD-10-CM | POA: Diagnosis not present

## 2017-12-23 DIAGNOSIS — S82002D Unspecified fracture of left patella, subsequent encounter for closed fracture with routine healing: Secondary | ICD-10-CM | POA: Diagnosis not present

## 2017-12-24 DIAGNOSIS — S51802D Unspecified open wound of left forearm, subsequent encounter: Secondary | ICD-10-CM | POA: Diagnosis not present

## 2017-12-24 DIAGNOSIS — F0280 Dementia in other diseases classified elsewhere without behavioral disturbance: Secondary | ICD-10-CM | POA: Diagnosis not present

## 2017-12-24 DIAGNOSIS — S82002D Unspecified fracture of left patella, subsequent encounter for closed fracture with routine healing: Secondary | ICD-10-CM | POA: Diagnosis not present

## 2017-12-24 DIAGNOSIS — G300 Alzheimer's disease with early onset: Secondary | ICD-10-CM | POA: Diagnosis not present

## 2017-12-24 DIAGNOSIS — M48062 Spinal stenosis, lumbar region with neurogenic claudication: Secondary | ICD-10-CM | POA: Diagnosis not present

## 2017-12-24 DIAGNOSIS — N390 Urinary tract infection, site not specified: Secondary | ICD-10-CM | POA: Diagnosis not present

## 2017-12-25 DIAGNOSIS — S82002D Unspecified fracture of left patella, subsequent encounter for closed fracture with routine healing: Secondary | ICD-10-CM | POA: Diagnosis not present

## 2017-12-25 DIAGNOSIS — N390 Urinary tract infection, site not specified: Secondary | ICD-10-CM | POA: Diagnosis not present

## 2017-12-25 DIAGNOSIS — F0280 Dementia in other diseases classified elsewhere without behavioral disturbance: Secondary | ICD-10-CM | POA: Diagnosis not present

## 2017-12-25 DIAGNOSIS — S51802D Unspecified open wound of left forearm, subsequent encounter: Secondary | ICD-10-CM | POA: Diagnosis not present

## 2017-12-25 DIAGNOSIS — G300 Alzheimer's disease with early onset: Secondary | ICD-10-CM | POA: Diagnosis not present

## 2017-12-25 DIAGNOSIS — M48062 Spinal stenosis, lumbar region with neurogenic claudication: Secondary | ICD-10-CM | POA: Diagnosis not present

## 2017-12-26 DIAGNOSIS — M48062 Spinal stenosis, lumbar region with neurogenic claudication: Secondary | ICD-10-CM | POA: Diagnosis not present

## 2017-12-26 DIAGNOSIS — G300 Alzheimer's disease with early onset: Secondary | ICD-10-CM | POA: Diagnosis not present

## 2017-12-26 DIAGNOSIS — S82002D Unspecified fracture of left patella, subsequent encounter for closed fracture with routine healing: Secondary | ICD-10-CM | POA: Diagnosis not present

## 2017-12-26 DIAGNOSIS — N390 Urinary tract infection, site not specified: Secondary | ICD-10-CM | POA: Diagnosis not present

## 2017-12-26 DIAGNOSIS — S51802D Unspecified open wound of left forearm, subsequent encounter: Secondary | ICD-10-CM | POA: Diagnosis not present

## 2017-12-26 DIAGNOSIS — F0280 Dementia in other diseases classified elsewhere without behavioral disturbance: Secondary | ICD-10-CM | POA: Diagnosis not present

## 2017-12-30 DIAGNOSIS — S82002D Unspecified fracture of left patella, subsequent encounter for closed fracture with routine healing: Secondary | ICD-10-CM | POA: Diagnosis not present

## 2017-12-30 DIAGNOSIS — F0280 Dementia in other diseases classified elsewhere without behavioral disturbance: Secondary | ICD-10-CM | POA: Diagnosis not present

## 2017-12-30 DIAGNOSIS — N390 Urinary tract infection, site not specified: Secondary | ICD-10-CM | POA: Diagnosis not present

## 2017-12-30 DIAGNOSIS — M48062 Spinal stenosis, lumbar region with neurogenic claudication: Secondary | ICD-10-CM | POA: Diagnosis not present

## 2017-12-30 DIAGNOSIS — S51802D Unspecified open wound of left forearm, subsequent encounter: Secondary | ICD-10-CM | POA: Diagnosis not present

## 2017-12-30 DIAGNOSIS — G300 Alzheimer's disease with early onset: Secondary | ICD-10-CM | POA: Diagnosis not present

## 2017-12-31 DIAGNOSIS — G300 Alzheimer's disease with early onset: Secondary | ICD-10-CM | POA: Diagnosis not present

## 2017-12-31 DIAGNOSIS — F0280 Dementia in other diseases classified elsewhere without behavioral disturbance: Secondary | ICD-10-CM | POA: Diagnosis not present

## 2017-12-31 DIAGNOSIS — G609 Hereditary and idiopathic neuropathy, unspecified: Secondary | ICD-10-CM | POA: Diagnosis not present

## 2017-12-31 DIAGNOSIS — N390 Urinary tract infection, site not specified: Secondary | ICD-10-CM | POA: Diagnosis not present

## 2017-12-31 DIAGNOSIS — R2681 Unsteadiness on feet: Secondary | ICD-10-CM | POA: Diagnosis not present

## 2017-12-31 DIAGNOSIS — G603 Idiopathic progressive neuropathy: Secondary | ICD-10-CM | POA: Diagnosis not present

## 2017-12-31 DIAGNOSIS — S51802D Unspecified open wound of left forearm, subsequent encounter: Secondary | ICD-10-CM | POA: Diagnosis not present

## 2017-12-31 DIAGNOSIS — R413 Other amnesia: Secondary | ICD-10-CM | POA: Diagnosis not present

## 2017-12-31 DIAGNOSIS — S82002D Unspecified fracture of left patella, subsequent encounter for closed fracture with routine healing: Secondary | ICD-10-CM | POA: Diagnosis not present

## 2017-12-31 DIAGNOSIS — Z79899 Other long term (current) drug therapy: Secondary | ICD-10-CM | POA: Diagnosis not present

## 2017-12-31 DIAGNOSIS — M48062 Spinal stenosis, lumbar region with neurogenic claudication: Secondary | ICD-10-CM | POA: Diagnosis not present

## 2018-01-01 DIAGNOSIS — N390 Urinary tract infection, site not specified: Secondary | ICD-10-CM | POA: Diagnosis not present

## 2018-01-01 DIAGNOSIS — F0280 Dementia in other diseases classified elsewhere without behavioral disturbance: Secondary | ICD-10-CM | POA: Diagnosis not present

## 2018-01-01 DIAGNOSIS — S82002D Unspecified fracture of left patella, subsequent encounter for closed fracture with routine healing: Secondary | ICD-10-CM | POA: Diagnosis not present

## 2018-01-01 DIAGNOSIS — G300 Alzheimer's disease with early onset: Secondary | ICD-10-CM | POA: Diagnosis not present

## 2018-01-01 DIAGNOSIS — S51802D Unspecified open wound of left forearm, subsequent encounter: Secondary | ICD-10-CM | POA: Diagnosis not present

## 2018-01-01 DIAGNOSIS — M48062 Spinal stenosis, lumbar region with neurogenic claudication: Secondary | ICD-10-CM | POA: Diagnosis not present

## 2018-01-02 DIAGNOSIS — F0280 Dementia in other diseases classified elsewhere without behavioral disturbance: Secondary | ICD-10-CM | POA: Diagnosis not present

## 2018-01-02 DIAGNOSIS — M48062 Spinal stenosis, lumbar region with neurogenic claudication: Secondary | ICD-10-CM | POA: Diagnosis not present

## 2018-01-02 DIAGNOSIS — N390 Urinary tract infection, site not specified: Secondary | ICD-10-CM | POA: Diagnosis not present

## 2018-01-02 DIAGNOSIS — S82002D Unspecified fracture of left patella, subsequent encounter for closed fracture with routine healing: Secondary | ICD-10-CM | POA: Diagnosis not present

## 2018-01-02 DIAGNOSIS — S51802D Unspecified open wound of left forearm, subsequent encounter: Secondary | ICD-10-CM | POA: Diagnosis not present

## 2018-01-02 DIAGNOSIS — G300 Alzheimer's disease with early onset: Secondary | ICD-10-CM | POA: Diagnosis not present

## 2018-01-07 DIAGNOSIS — S51802D Unspecified open wound of left forearm, subsequent encounter: Secondary | ICD-10-CM | POA: Diagnosis not present

## 2018-01-07 DIAGNOSIS — F0280 Dementia in other diseases classified elsewhere without behavioral disturbance: Secondary | ICD-10-CM | POA: Diagnosis not present

## 2018-01-07 DIAGNOSIS — G300 Alzheimer's disease with early onset: Secondary | ICD-10-CM | POA: Diagnosis not present

## 2018-01-07 DIAGNOSIS — S82002D Unspecified fracture of left patella, subsequent encounter for closed fracture with routine healing: Secondary | ICD-10-CM | POA: Diagnosis not present

## 2018-01-07 DIAGNOSIS — N390 Urinary tract infection, site not specified: Secondary | ICD-10-CM | POA: Diagnosis not present

## 2018-01-07 DIAGNOSIS — M48062 Spinal stenosis, lumbar region with neurogenic claudication: Secondary | ICD-10-CM | POA: Diagnosis not present

## 2018-01-09 DIAGNOSIS — M48062 Spinal stenosis, lumbar region with neurogenic claudication: Secondary | ICD-10-CM | POA: Diagnosis not present

## 2018-01-09 DIAGNOSIS — S82002D Unspecified fracture of left patella, subsequent encounter for closed fracture with routine healing: Secondary | ICD-10-CM | POA: Diagnosis not present

## 2018-01-09 DIAGNOSIS — N390 Urinary tract infection, site not specified: Secondary | ICD-10-CM | POA: Diagnosis not present

## 2018-01-09 DIAGNOSIS — S51802D Unspecified open wound of left forearm, subsequent encounter: Secondary | ICD-10-CM | POA: Diagnosis not present

## 2018-01-09 DIAGNOSIS — G300 Alzheimer's disease with early onset: Secondary | ICD-10-CM | POA: Diagnosis not present

## 2018-01-09 DIAGNOSIS — F0280 Dementia in other diseases classified elsewhere without behavioral disturbance: Secondary | ICD-10-CM | POA: Diagnosis not present

## 2018-01-13 DIAGNOSIS — S51802D Unspecified open wound of left forearm, subsequent encounter: Secondary | ICD-10-CM | POA: Diagnosis not present

## 2018-01-13 DIAGNOSIS — F0280 Dementia in other diseases classified elsewhere without behavioral disturbance: Secondary | ICD-10-CM | POA: Diagnosis not present

## 2018-01-13 DIAGNOSIS — G300 Alzheimer's disease with early onset: Secondary | ICD-10-CM | POA: Diagnosis not present

## 2018-01-13 DIAGNOSIS — N390 Urinary tract infection, site not specified: Secondary | ICD-10-CM | POA: Diagnosis not present

## 2018-01-13 DIAGNOSIS — S82002D Unspecified fracture of left patella, subsequent encounter for closed fracture with routine healing: Secondary | ICD-10-CM | POA: Diagnosis not present

## 2018-01-13 DIAGNOSIS — M48062 Spinal stenosis, lumbar region with neurogenic claudication: Secondary | ICD-10-CM | POA: Diagnosis not present

## 2018-01-15 DIAGNOSIS — G300 Alzheimer's disease with early onset: Secondary | ICD-10-CM | POA: Diagnosis not present

## 2018-01-15 DIAGNOSIS — S51802D Unspecified open wound of left forearm, subsequent encounter: Secondary | ICD-10-CM | POA: Diagnosis not present

## 2018-01-15 DIAGNOSIS — F0280 Dementia in other diseases classified elsewhere without behavioral disturbance: Secondary | ICD-10-CM | POA: Diagnosis not present

## 2018-01-15 DIAGNOSIS — S82002D Unspecified fracture of left patella, subsequent encounter for closed fracture with routine healing: Secondary | ICD-10-CM | POA: Diagnosis not present

## 2018-01-15 DIAGNOSIS — N390 Urinary tract infection, site not specified: Secondary | ICD-10-CM | POA: Diagnosis not present

## 2018-01-15 DIAGNOSIS — M48062 Spinal stenosis, lumbar region with neurogenic claudication: Secondary | ICD-10-CM | POA: Diagnosis not present

## 2018-01-16 DIAGNOSIS — F0390 Unspecified dementia without behavioral disturbance: Secondary | ICD-10-CM | POA: Diagnosis not present

## 2018-01-20 DIAGNOSIS — M48062 Spinal stenosis, lumbar region with neurogenic claudication: Secondary | ICD-10-CM | POA: Diagnosis not present

## 2018-01-20 DIAGNOSIS — F0280 Dementia in other diseases classified elsewhere without behavioral disturbance: Secondary | ICD-10-CM | POA: Diagnosis not present

## 2018-01-20 DIAGNOSIS — S82002D Unspecified fracture of left patella, subsequent encounter for closed fracture with routine healing: Secondary | ICD-10-CM | POA: Diagnosis not present

## 2018-01-20 DIAGNOSIS — G300 Alzheimer's disease with early onset: Secondary | ICD-10-CM | POA: Diagnosis not present

## 2018-01-20 DIAGNOSIS — N390 Urinary tract infection, site not specified: Secondary | ICD-10-CM | POA: Diagnosis not present

## 2018-01-20 DIAGNOSIS — S51802D Unspecified open wound of left forearm, subsequent encounter: Secondary | ICD-10-CM | POA: Diagnosis not present

## 2018-01-25 DIAGNOSIS — J441 Chronic obstructive pulmonary disease with (acute) exacerbation: Secondary | ICD-10-CM | POA: Diagnosis not present

## 2018-01-25 DIAGNOSIS — R000 Tachycardia, unspecified: Secondary | ICD-10-CM | POA: Diagnosis not present

## 2018-01-25 DIAGNOSIS — R0902 Hypoxemia: Secondary | ICD-10-CM | POA: Diagnosis not present

## 2018-01-25 DIAGNOSIS — R0602 Shortness of breath: Secondary | ICD-10-CM | POA: Diagnosis not present

## 2018-01-25 DIAGNOSIS — I4891 Unspecified atrial fibrillation: Secondary | ICD-10-CM | POA: Diagnosis not present

## 2018-01-25 DIAGNOSIS — R918 Other nonspecific abnormal finding of lung field: Secondary | ICD-10-CM | POA: Diagnosis not present

## 2018-01-25 DIAGNOSIS — J189 Pneumonia, unspecified organism: Secondary | ICD-10-CM | POA: Diagnosis not present

## 2018-01-25 DIAGNOSIS — R0689 Other abnormalities of breathing: Secondary | ICD-10-CM | POA: Diagnosis not present

## 2018-01-26 DIAGNOSIS — E876 Hypokalemia: Secondary | ICD-10-CM | POA: Diagnosis present

## 2018-01-26 DIAGNOSIS — A419 Sepsis, unspecified organism: Secondary | ICD-10-CM | POA: Diagnosis present

## 2018-01-26 DIAGNOSIS — N400 Enlarged prostate without lower urinary tract symptoms: Secondary | ICD-10-CM | POA: Diagnosis present

## 2018-01-26 DIAGNOSIS — J9621 Acute and chronic respiratory failure with hypoxia: Secondary | ICD-10-CM | POA: Diagnosis present

## 2018-01-26 DIAGNOSIS — F419 Anxiety disorder, unspecified: Secondary | ICD-10-CM | POA: Diagnosis present

## 2018-01-26 DIAGNOSIS — R0902 Hypoxemia: Secondary | ICD-10-CM | POA: Diagnosis not present

## 2018-01-26 DIAGNOSIS — K219 Gastro-esophageal reflux disease without esophagitis: Secondary | ICD-10-CM | POA: Diagnosis present

## 2018-01-26 DIAGNOSIS — R531 Weakness: Secondary | ICD-10-CM | POA: Diagnosis not present

## 2018-01-26 DIAGNOSIS — F0390 Unspecified dementia without behavioral disturbance: Secondary | ICD-10-CM | POA: Diagnosis not present

## 2018-01-26 DIAGNOSIS — J9601 Acute respiratory failure with hypoxia: Secondary | ICD-10-CM | POA: Diagnosis not present

## 2018-01-26 DIAGNOSIS — Z79899 Other long term (current) drug therapy: Secondary | ICD-10-CM | POA: Diagnosis not present

## 2018-01-26 DIAGNOSIS — G301 Alzheimer's disease with late onset: Secondary | ICD-10-CM | POA: Diagnosis present

## 2018-01-26 DIAGNOSIS — I482 Chronic atrial fibrillation, unspecified: Secondary | ICD-10-CM | POA: Diagnosis present

## 2018-01-26 DIAGNOSIS — R000 Tachycardia, unspecified: Secondary | ICD-10-CM | POA: Diagnosis not present

## 2018-01-26 DIAGNOSIS — I10 Essential (primary) hypertension: Secondary | ICD-10-CM | POA: Diagnosis not present

## 2018-01-26 DIAGNOSIS — Z9981 Dependence on supplemental oxygen: Secondary | ICD-10-CM | POA: Diagnosis not present

## 2018-01-26 DIAGNOSIS — J449 Chronic obstructive pulmonary disease, unspecified: Secondary | ICD-10-CM | POA: Diagnosis not present

## 2018-01-26 DIAGNOSIS — I110 Hypertensive heart disease with heart failure: Secondary | ICD-10-CM | POA: Diagnosis present

## 2018-01-26 DIAGNOSIS — I509 Heart failure, unspecified: Secondary | ICD-10-CM | POA: Diagnosis present

## 2018-01-26 DIAGNOSIS — R2689 Other abnormalities of gait and mobility: Secondary | ICD-10-CM | POA: Diagnosis not present

## 2018-01-26 DIAGNOSIS — F0281 Dementia in other diseases classified elsewhere with behavioral disturbance: Secondary | ICD-10-CM | POA: Diagnosis not present

## 2018-01-26 DIAGNOSIS — I4891 Unspecified atrial fibrillation: Secondary | ICD-10-CM | POA: Diagnosis present

## 2018-01-26 DIAGNOSIS — J441 Chronic obstructive pulmonary disease with (acute) exacerbation: Secondary | ICD-10-CM | POA: Diagnosis not present

## 2018-01-26 DIAGNOSIS — R918 Other nonspecific abnormal finding of lung field: Secondary | ICD-10-CM | POA: Diagnosis not present

## 2018-01-26 DIAGNOSIS — I959 Hypotension, unspecified: Secondary | ICD-10-CM | POA: Diagnosis not present

## 2018-01-26 DIAGNOSIS — J440 Chronic obstructive pulmonary disease with acute lower respiratory infection: Secondary | ICD-10-CM | POA: Diagnosis present

## 2018-01-26 DIAGNOSIS — R41841 Cognitive communication deficit: Secondary | ICD-10-CM | POA: Diagnosis not present

## 2018-01-26 DIAGNOSIS — M6259 Muscle wasting and atrophy, not elsewhere classified, multiple sites: Secondary | ICD-10-CM | POA: Diagnosis not present

## 2018-01-26 DIAGNOSIS — J189 Pneumonia, unspecified organism: Secondary | ICD-10-CM | POA: Diagnosis present

## 2018-01-26 DIAGNOSIS — E46 Unspecified protein-calorie malnutrition: Secondary | ICD-10-CM | POA: Diagnosis not present

## 2018-01-26 DIAGNOSIS — Z7401 Bed confinement status: Secondary | ICD-10-CM | POA: Diagnosis not present

## 2018-01-26 DIAGNOSIS — E860 Dehydration: Secondary | ICD-10-CM | POA: Diagnosis present

## 2018-01-26 DIAGNOSIS — Z741 Need for assistance with personal care: Secondary | ICD-10-CM | POA: Diagnosis not present

## 2018-01-26 DIAGNOSIS — Z87891 Personal history of nicotine dependence: Secondary | ICD-10-CM | POA: Diagnosis not present

## 2018-01-27 DIAGNOSIS — E876 Hypokalemia: Secondary | ICD-10-CM

## 2018-01-27 DIAGNOSIS — N400 Enlarged prostate without lower urinary tract symptoms: Secondary | ICD-10-CM

## 2018-01-27 DIAGNOSIS — E860 Dehydration: Secondary | ICD-10-CM

## 2018-01-27 DIAGNOSIS — J9621 Acute and chronic respiratory failure with hypoxia: Secondary | ICD-10-CM

## 2018-01-27 DIAGNOSIS — I4891 Unspecified atrial fibrillation: Secondary | ICD-10-CM

## 2018-01-29 DIAGNOSIS — I4891 Unspecified atrial fibrillation: Secondary | ICD-10-CM | POA: Diagnosis not present

## 2018-01-29 DIAGNOSIS — F419 Anxiety disorder, unspecified: Secondary | ICD-10-CM | POA: Diagnosis not present

## 2018-01-29 DIAGNOSIS — E46 Unspecified protein-calorie malnutrition: Secondary | ICD-10-CM | POA: Diagnosis not present

## 2018-01-29 DIAGNOSIS — E860 Dehydration: Secondary | ICD-10-CM | POA: Diagnosis not present

## 2018-01-29 DIAGNOSIS — R262 Difficulty in walking, not elsewhere classified: Secondary | ICD-10-CM | POA: Diagnosis not present

## 2018-01-29 DIAGNOSIS — R2689 Other abnormalities of gait and mobility: Secondary | ICD-10-CM | POA: Diagnosis not present

## 2018-01-29 DIAGNOSIS — A419 Sepsis, unspecified organism: Secondary | ICD-10-CM | POA: Diagnosis not present

## 2018-01-29 DIAGNOSIS — Z741 Need for assistance with personal care: Secondary | ICD-10-CM | POA: Diagnosis not present

## 2018-01-29 DIAGNOSIS — K219 Gastro-esophageal reflux disease without esophagitis: Secondary | ICD-10-CM | POA: Diagnosis not present

## 2018-01-29 DIAGNOSIS — I959 Hypotension, unspecified: Secondary | ICD-10-CM | POA: Diagnosis not present

## 2018-01-29 DIAGNOSIS — J9611 Chronic respiratory failure with hypoxia: Secondary | ICD-10-CM | POA: Diagnosis not present

## 2018-01-29 DIAGNOSIS — J440 Chronic obstructive pulmonary disease with acute lower respiratory infection: Secondary | ICD-10-CM | POA: Diagnosis not present

## 2018-01-29 DIAGNOSIS — E876 Hypokalemia: Secondary | ICD-10-CM | POA: Diagnosis not present

## 2018-01-29 DIAGNOSIS — Z7401 Bed confinement status: Secondary | ICD-10-CM | POA: Diagnosis not present

## 2018-01-29 DIAGNOSIS — R000 Tachycardia, unspecified: Secondary | ICD-10-CM | POA: Diagnosis not present

## 2018-01-29 DIAGNOSIS — J9621 Acute and chronic respiratory failure with hypoxia: Secondary | ICD-10-CM | POA: Diagnosis not present

## 2018-01-29 DIAGNOSIS — J189 Pneumonia, unspecified organism: Secondary | ICD-10-CM | POA: Diagnosis not present

## 2018-01-29 DIAGNOSIS — I10 Essential (primary) hypertension: Secondary | ICD-10-CM | POA: Diagnosis not present

## 2018-01-29 DIAGNOSIS — N400 Enlarged prostate without lower urinary tract symptoms: Secondary | ICD-10-CM | POA: Diagnosis not present

## 2018-01-29 DIAGNOSIS — R531 Weakness: Secondary | ICD-10-CM | POA: Diagnosis not present

## 2018-01-29 DIAGNOSIS — M6259 Muscle wasting and atrophy, not elsewhere classified, multiple sites: Secondary | ICD-10-CM | POA: Diagnosis not present

## 2018-01-29 DIAGNOSIS — F0390 Unspecified dementia without behavioral disturbance: Secondary | ICD-10-CM | POA: Diagnosis not present

## 2018-01-29 DIAGNOSIS — R41841 Cognitive communication deficit: Secondary | ICD-10-CM | POA: Diagnosis not present

## 2018-01-31 DIAGNOSIS — J9621 Acute and chronic respiratory failure with hypoxia: Secondary | ICD-10-CM | POA: Diagnosis not present

## 2018-01-31 DIAGNOSIS — J9611 Chronic respiratory failure with hypoxia: Secondary | ICD-10-CM | POA: Diagnosis not present

## 2018-01-31 DIAGNOSIS — F0390 Unspecified dementia without behavioral disturbance: Secondary | ICD-10-CM | POA: Diagnosis not present

## 2018-01-31 DIAGNOSIS — R262 Difficulty in walking, not elsewhere classified: Secondary | ICD-10-CM | POA: Diagnosis not present

## 2018-01-31 DIAGNOSIS — J189 Pneumonia, unspecified organism: Secondary | ICD-10-CM | POA: Diagnosis not present

## 2018-02-07 ENCOUNTER — Other Ambulatory Visit: Payer: Self-pay | Admitting: *Deleted

## 2018-02-07 NOTE — Patient Outreach (Signed)
[facility]) Care Management  02/07/2018  Richard Baker [birth year] [id number]   Facility site visit at [facility] in [place].  Met with facility discharge planner/SW Richard Baker to discuss patient's discharge.  Richard Baker stated this patient would benefit from having [place] Management check on him when he discharged home. Richard Baker stated patient plan's to discharge home with his spouse on Sunday February 09, 2018.   Went to see patient in room 711, but patient was not in his room.  Nurse at nurses station made me aware patient was visiting in the common room with his brother and sister in law.  Patient was sitting in a chair wearing 02. Introduced myself to patient and family. Patient was slow to speak and had a hard time remembering his spouses name. Attempted to ask patient questions about his discharge but patient seemed not to understand.  Patient's brother stated  Patient's spouse Richard Baker is the patient's main caregiver.  Asked patient who transported him to doctor's office and he did say his wife. Asked patient if [facility] could contact his spouse and patient agreed.   [place] [facility] services and patient agreed to services.  Gave [facility] packet and contact information to patient.   Made Richard Baker aware patient accepted services.  Plan to make [facility] team member aware of patient agreeing to [place].   Referral sent for [facility] Manager to engage patient for transition of care.   [name] RN, BSN [name] [phone]) Business Mobile [phone]) Toll free office

## 2018-02-10 DIAGNOSIS — M48062 Spinal stenosis, lumbar region with neurogenic claudication: Secondary | ICD-10-CM | POA: Diagnosis not present

## 2018-02-10 DIAGNOSIS — S51802D Unspecified open wound of left forearm, subsequent encounter: Secondary | ICD-10-CM | POA: Diagnosis not present

## 2018-02-10 DIAGNOSIS — F0280 Dementia in other diseases classified elsewhere without behavioral disturbance: Secondary | ICD-10-CM | POA: Diagnosis not present

## 2018-02-10 DIAGNOSIS — S82002D Unspecified fracture of left patella, subsequent encounter for closed fracture with routine healing: Secondary | ICD-10-CM | POA: Diagnosis not present

## 2018-02-10 DIAGNOSIS — N390 Urinary tract infection, site not specified: Secondary | ICD-10-CM | POA: Diagnosis not present

## 2018-02-10 DIAGNOSIS — G300 Alzheimer's disease with early onset: Secondary | ICD-10-CM | POA: Diagnosis not present

## 2018-02-12 ENCOUNTER — Other Ambulatory Visit: Payer: Self-pay

## 2018-02-12 DIAGNOSIS — S82002D Unspecified fracture of left patella, subsequent encounter for closed fracture with routine healing: Secondary | ICD-10-CM | POA: Diagnosis not present

## 2018-02-12 DIAGNOSIS — F0280 Dementia in other diseases classified elsewhere without behavioral disturbance: Secondary | ICD-10-CM | POA: Diagnosis not present

## 2018-02-12 DIAGNOSIS — N390 Urinary tract infection, site not specified: Secondary | ICD-10-CM | POA: Diagnosis not present

## 2018-02-12 DIAGNOSIS — G300 Alzheimer's disease with early onset: Secondary | ICD-10-CM | POA: Diagnosis not present

## 2018-02-12 DIAGNOSIS — M48062 Spinal stenosis, lumbar region with neurogenic claudication: Secondary | ICD-10-CM | POA: Diagnosis not present

## 2018-02-12 DIAGNOSIS — S51802D Unspecified open wound of left forearm, subsequent encounter: Secondary | ICD-10-CM | POA: Diagnosis not present

## 2018-02-12 NOTE — Patient Outreach (Signed)
Screening: New referral: Discharged from [facility] on 02/09/2018.   Primary MD office does transition of care.  [facility]  Place call to patient. No answer and no machine.   PLAN: will send outreach letter and call back in 3-4 business days.  Rowe PavyAmanda Mell Guia, RN, BSN, CEN [facility] Coordinator [phone]

## 2018-02-13 DIAGNOSIS — F0280 Dementia in other diseases classified elsewhere without behavioral disturbance: Secondary | ICD-10-CM | POA: Diagnosis not present

## 2018-02-13 DIAGNOSIS — S51802D Unspecified open wound of left forearm, subsequent encounter: Secondary | ICD-10-CM | POA: Diagnosis not present

## 2018-02-13 DIAGNOSIS — N390 Urinary tract infection, site not specified: Secondary | ICD-10-CM | POA: Diagnosis not present

## 2018-02-13 DIAGNOSIS — M48062 Spinal stenosis, lumbar region with neurogenic claudication: Secondary | ICD-10-CM | POA: Diagnosis not present

## 2018-02-13 DIAGNOSIS — S82002D Unspecified fracture of left patella, subsequent encounter for closed fracture with routine healing: Secondary | ICD-10-CM | POA: Diagnosis not present

## 2018-02-13 DIAGNOSIS — G300 Alzheimer's disease with early onset: Secondary | ICD-10-CM | POA: Diagnosis not present

## 2018-02-14 DIAGNOSIS — S51802D Unspecified open wound of left forearm, subsequent encounter: Secondary | ICD-10-CM | POA: Diagnosis not present

## 2018-02-14 DIAGNOSIS — G300 Alzheimer's disease with early onset: Secondary | ICD-10-CM | POA: Diagnosis not present

## 2018-02-14 DIAGNOSIS — F0280 Dementia in other diseases classified elsewhere without behavioral disturbance: Secondary | ICD-10-CM | POA: Diagnosis not present

## 2018-02-14 DIAGNOSIS — N390 Urinary tract infection, site not specified: Secondary | ICD-10-CM | POA: Diagnosis not present

## 2018-02-14 DIAGNOSIS — M48062 Spinal stenosis, lumbar region with neurogenic claudication: Secondary | ICD-10-CM | POA: Diagnosis not present

## 2018-02-14 DIAGNOSIS — S82002D Unspecified fracture of left patella, subsequent encounter for closed fracture with routine healing: Secondary | ICD-10-CM | POA: Diagnosis not present

## 2018-02-17 ENCOUNTER — Other Ambulatory Visit: Payer: Self-pay

## 2018-02-17 DIAGNOSIS — S82002D Unspecified fracture of left patella, subsequent encounter for closed fracture with routine healing: Secondary | ICD-10-CM | POA: Diagnosis not present

## 2018-02-17 DIAGNOSIS — F0280 Dementia in other diseases classified elsewhere without behavioral disturbance: Secondary | ICD-10-CM | POA: Diagnosis not present

## 2018-02-17 DIAGNOSIS — M48062 Spinal stenosis, lumbar region with neurogenic claudication: Secondary | ICD-10-CM | POA: Diagnosis not present

## 2018-02-17 DIAGNOSIS — S51802D Unspecified open wound of left forearm, subsequent encounter: Secondary | ICD-10-CM | POA: Diagnosis not present

## 2018-02-17 DIAGNOSIS — G300 Alzheimer's disease with early onset: Secondary | ICD-10-CM | POA: Diagnosis not present

## 2018-02-17 DIAGNOSIS — N390 Urinary tract infection, site not specified: Secondary | ICD-10-CM | POA: Diagnosis not present

## 2018-02-17 NOTE — Patient Outreach (Signed)
Screening: Second attempt to reach patient for [facility] to [facility]. No answer.  PLAN: will attempt again in 3-4 days.  Outreach letter already mailed.   Rowe PavyAmanda Haileyann Staiger, RN, BSN, CEN [facility] Coordinator [phone]

## 2018-02-18 DIAGNOSIS — F0280 Dementia in other diseases classified elsewhere without behavioral disturbance: Secondary | ICD-10-CM | POA: Diagnosis not present

## 2018-02-18 DIAGNOSIS — M48062 Spinal stenosis, lumbar region with neurogenic claudication: Secondary | ICD-10-CM | POA: Diagnosis not present

## 2018-02-18 DIAGNOSIS — S51802D Unspecified open wound of left forearm, subsequent encounter: Secondary | ICD-10-CM | POA: Diagnosis not present

## 2018-02-18 DIAGNOSIS — S82002D Unspecified fracture of left patella, subsequent encounter for closed fracture with routine healing: Secondary | ICD-10-CM | POA: Diagnosis not present

## 2018-02-18 DIAGNOSIS — N390 Urinary tract infection, site not specified: Secondary | ICD-10-CM | POA: Diagnosis not present

## 2018-02-18 DIAGNOSIS — G300 Alzheimer's disease with early onset: Secondary | ICD-10-CM | POA: Diagnosis not present

## 2018-02-19 DIAGNOSIS — G300 Alzheimer's disease with early onset: Secondary | ICD-10-CM | POA: Diagnosis not present

## 2018-02-19 DIAGNOSIS — S51802D Unspecified open wound of left forearm, subsequent encounter: Secondary | ICD-10-CM | POA: Diagnosis not present

## 2018-02-19 DIAGNOSIS — S82002D Unspecified fracture of left patella, subsequent encounter for closed fracture with routine healing: Secondary | ICD-10-CM | POA: Diagnosis not present

## 2018-02-19 DIAGNOSIS — M48062 Spinal stenosis, lumbar region with neurogenic claudication: Secondary | ICD-10-CM | POA: Diagnosis not present

## 2018-02-19 DIAGNOSIS — F0280 Dementia in other diseases classified elsewhere without behavioral disturbance: Secondary | ICD-10-CM | POA: Diagnosis not present

## 2018-02-19 DIAGNOSIS — N390 Urinary tract infection, site not specified: Secondary | ICD-10-CM | POA: Diagnosis not present

## 2018-02-20 DIAGNOSIS — J189 Pneumonia, unspecified organism: Secondary | ICD-10-CM | POA: Diagnosis not present

## 2018-02-20 DIAGNOSIS — E876 Hypokalemia: Secondary | ICD-10-CM | POA: Diagnosis not present

## 2018-02-20 DIAGNOSIS — J181 Lobar pneumonia, unspecified organism: Secondary | ICD-10-CM | POA: Diagnosis not present

## 2018-02-20 DIAGNOSIS — R05 Cough: Secondary | ICD-10-CM | POA: Diagnosis not present

## 2018-02-21 ENCOUNTER — Other Ambulatory Visit: Payer: Self-pay

## 2018-02-21 NOTE — Patient Outreach (Signed)
Screening: 3rd call to patient to review [facility].No answer. Letter already mailed.   PLAN: will close case on 02/27/2018 if no response.  Rowe PavyAmanda Khrystal Jeanmarie, RN, BSN, CEN [facility] Coordinator [phone]

## 2018-02-27 ENCOUNTER — Other Ambulatory Visit: Payer: Self-pay

## 2018-02-27 NOTE — Patient Outreach (Signed)
Case closure:  Unable to reach patient and no response to outreach letter.  PLAN: Case closure. Will send letter to MD.  Rowe PavyAmanda Kayson Bullis, RN, BSN, CEN [facility] Coordinator [phone]

## 2018-03-01 DIAGNOSIS — I110 Hypertensive heart disease with heart failure: Secondary | ICD-10-CM | POA: Diagnosis present

## 2018-03-01 DIAGNOSIS — J9621 Acute and chronic respiratory failure with hypoxia: Secondary | ICD-10-CM | POA: Diagnosis not present

## 2018-03-01 DIAGNOSIS — I4891 Unspecified atrial fibrillation: Secondary | ICD-10-CM | POA: Diagnosis present

## 2018-03-01 DIAGNOSIS — Z87891 Personal history of nicotine dependence: Secondary | ICD-10-CM | POA: Diagnosis not present

## 2018-03-01 DIAGNOSIS — K219 Gastro-esophageal reflux disease without esophagitis: Secondary | ICD-10-CM | POA: Diagnosis not present

## 2018-03-01 DIAGNOSIS — R7989 Other specified abnormal findings of blood chemistry: Secondary | ICD-10-CM | POA: Diagnosis present

## 2018-03-01 DIAGNOSIS — I5021 Acute systolic (congestive) heart failure: Secondary | ICD-10-CM | POA: Diagnosis present

## 2018-03-01 DIAGNOSIS — J9601 Acute respiratory failure with hypoxia: Secondary | ICD-10-CM | POA: Diagnosis not present

## 2018-03-01 DIAGNOSIS — Z9981 Dependence on supplemental oxygen: Secondary | ICD-10-CM | POA: Diagnosis not present

## 2018-03-01 DIAGNOSIS — I361 Nonrheumatic tricuspid (valve) insufficiency: Secondary | ICD-10-CM | POA: Diagnosis not present

## 2018-03-01 DIAGNOSIS — J440 Chronic obstructive pulmonary disease with acute lower respiratory infection: Secondary | ICD-10-CM | POA: Diagnosis present

## 2018-03-01 DIAGNOSIS — N400 Enlarged prostate without lower urinary tract symptoms: Secondary | ICD-10-CM | POA: Diagnosis not present

## 2018-03-01 DIAGNOSIS — F0390 Unspecified dementia without behavioral disturbance: Secondary | ICD-10-CM | POA: Diagnosis present

## 2018-03-01 DIAGNOSIS — R000 Tachycardia, unspecified: Secondary | ICD-10-CM | POA: Diagnosis present

## 2018-03-01 DIAGNOSIS — E860 Dehydration: Secondary | ICD-10-CM | POA: Diagnosis present

## 2018-03-01 DIAGNOSIS — Z79899 Other long term (current) drug therapy: Secondary | ICD-10-CM | POA: Diagnosis not present

## 2018-03-01 DIAGNOSIS — R0602 Shortness of breath: Secondary | ICD-10-CM | POA: Diagnosis not present

## 2018-03-01 DIAGNOSIS — R05 Cough: Secondary | ICD-10-CM | POA: Diagnosis not present

## 2018-03-01 DIAGNOSIS — R1310 Dysphagia, unspecified: Secondary | ICD-10-CM | POA: Diagnosis present

## 2018-03-01 DIAGNOSIS — I10 Essential (primary) hypertension: Secondary | ICD-10-CM | POA: Diagnosis not present

## 2018-03-01 DIAGNOSIS — J189 Pneumonia, unspecified organism: Secondary | ICD-10-CM | POA: Diagnosis not present

## 2018-03-01 DIAGNOSIS — F419 Anxiety disorder, unspecified: Secondary | ICD-10-CM | POA: Diagnosis present

## 2018-03-01 DIAGNOSIS — Z792 Long term (current) use of antibiotics: Secondary | ICD-10-CM | POA: Diagnosis not present

## 2018-03-02 DIAGNOSIS — I361 Nonrheumatic tricuspid (valve) insufficiency: Secondary | ICD-10-CM | POA: Diagnosis not present

## 2018-03-03 DIAGNOSIS — J9621 Acute and chronic respiratory failure with hypoxia: Secondary | ICD-10-CM

## 2018-03-06 ENCOUNTER — Other Ambulatory Visit: Payer: Self-pay

## 2018-03-06 NOTE — Patient Outreach (Signed)
Outreach attempt:  Placed call to phone number in Epic and phone number was not accepting calls. Reviewed [facility] and noted additional phone number. [phone].  Placed call to this phone line and wife answered and reports that she is health care power of attorney and that patient was unavailable. Explained [facility] program and the reason for my call to follow up on status since discharge from [facility] on 03/04/2018. Wife reports patient is weighing daily, following low salt diet, taking medications as prescribed.Wife reports todays weight of 176 pounds and yesterday weight of 175.8 pounds.  Wife states that she is waiting for home health "well care" to show up. Reviewed pending orders for PT, OT, RN,bath aide, and speech therapy. Offered to call well care and wife states that she will call them herself.   Wife reports a "[facility]" nurse called today. Reports visit with primary MD planned for 03/17/2018.  Offered home visit and wife wants to think about it and for me to call back on 03/11/2018. I offered to mail a welcome packet and letter and wife agreed. Wife confirmed address,  PLAN: will follow up next week for acceptance of [facility] assistance. Reviewed with wife when to call MD for weight gain.  Rowe Pavy, RN, BSN, CEN [facility] [employer] [phone]

## 2018-03-11 ENCOUNTER — Other Ambulatory Visit: Payer: Self-pay

## 2018-03-12 ENCOUNTER — Other Ambulatory Visit: Payer: Self-pay

## 2018-03-12 NOTE — Patient Outreach (Signed)
Telephone assessment:  Placed call to patient as planned with wife. No answer. Left a message requesting a call back.  PLAN: will reattempt in 1 day.  Rowe Pavy, RN, BSN, CEN [facility] [employer] [phone]

## 2018-03-12 NOTE — Patient Outreach (Signed)
Outreach attempt #2  Placed call to patient. No answer.  PLAN: will attempt again. Will Higher education careers adviser.  Rowe Pavy, RN, BSN, CEN [facility] [employer] [phone]

## 2018-03-17 ENCOUNTER — Other Ambulatory Visit: Payer: Self-pay

## 2018-03-17 NOTE — Patient Outreach (Signed)
Screening:  Initally contacted patients wife on    03/06/2018   , she requested a return call on 03/11/2018. Placed call again on   1/7/202     , with no answer. Left a message requesting a call back. Called again on 03/12/2018 with no answer and left a message.  Placed call again on 03/17/2018 with no answer and left a message.   PLAN: if no response by 03/21/2018 will close case as unable to reach.  Rowe Pavy, RN, BSN, CEN [facility] [employer] [phone]

## 2018-03-21 ENCOUNTER — Other Ambulatory Visit: Payer: Self-pay

## 2018-03-21 NOTE — Patient Outreach (Signed)
Case closure:  Unable to reach patient via phone or letter. No return calls.  PLAN: close case as unable to reach.  Rowe Pavy, RN, BSN, CEN [facility] [employer] [phone]

## 2019-03-30 DIAGNOSIS — J449 Chronic obstructive pulmonary disease, unspecified: Secondary | ICD-10-CM

## 2019-03-30 DIAGNOSIS — I5033 Acute on chronic diastolic (congestive) heart failure: Secondary | ICD-10-CM | POA: Diagnosis not present

## 2019-03-30 DIAGNOSIS — R4182 Altered mental status, unspecified: Secondary | ICD-10-CM

## 2019-03-30 DIAGNOSIS — E876 Hypokalemia: Secondary | ICD-10-CM

## 2019-03-31 DIAGNOSIS — J449 Chronic obstructive pulmonary disease, unspecified: Secondary | ICD-10-CM | POA: Diagnosis not present

## 2019-03-31 DIAGNOSIS — I5033 Acute on chronic diastolic (congestive) heart failure: Secondary | ICD-10-CM | POA: Diagnosis not present

## 2019-03-31 DIAGNOSIS — R4182 Altered mental status, unspecified: Secondary | ICD-10-CM | POA: Diagnosis not present

## 2019-03-31 DIAGNOSIS — E876 Hypokalemia: Secondary | ICD-10-CM | POA: Diagnosis not present

## 2019-04-01 DIAGNOSIS — E876 Hypokalemia: Secondary | ICD-10-CM | POA: Diagnosis not present

## 2019-04-01 DIAGNOSIS — R4182 Altered mental status, unspecified: Secondary | ICD-10-CM | POA: Diagnosis not present

## 2019-04-01 DIAGNOSIS — J449 Chronic obstructive pulmonary disease, unspecified: Secondary | ICD-10-CM | POA: Diagnosis not present

## 2019-04-02 DIAGNOSIS — R4182 Altered mental status, unspecified: Secondary | ICD-10-CM | POA: Diagnosis not present

## 2019-04-02 DIAGNOSIS — J449 Chronic obstructive pulmonary disease, unspecified: Secondary | ICD-10-CM | POA: Diagnosis not present

## 2019-04-02 DIAGNOSIS — E876 Hypokalemia: Secondary | ICD-10-CM | POA: Diagnosis not present

## 2019-05-04 DEATH — deceased
